# Patient Record
Sex: Female | Born: 1990 | Race: Black or African American | Hispanic: No | Marital: Single | Smoking: Never smoker
Health system: Southern US, Community
[De-identification: ages and names within clinical notes are randomized; demographics above are authoritative.]

## PROBLEM LIST (undated history)

## (undated) DIAGNOSIS — D649 Anemia, unspecified: Secondary | ICD-10-CM

## (undated) HISTORY — PX: NO PAST SURGERIES: SHX2092

---
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
## 2013-03-29 ENCOUNTER — Encounter (HOSPITAL_BASED_OUTPATIENT_CLINIC_OR_DEPARTMENT_OTHER): Payer: Self-pay | Admitting: Emergency Medicine

## 2013-03-29 ENCOUNTER — Emergency Department (HOSPITAL_BASED_OUTPATIENT_CLINIC_OR_DEPARTMENT_OTHER)
Admission: EM | Admit: 2013-03-29 | Discharge: 2013-03-29 | Disposition: A | Discharge status: Home / Self Care | Payer: Medicaid Other | Attending: Emergency Medicine | Admitting: Emergency Medicine

## 2013-03-29 DIAGNOSIS — I959 Hypotension, unspecified: Secondary | ICD-10-CM | POA: Insufficient documentation

## 2013-03-29 DIAGNOSIS — R55 Syncope and collapse: Secondary | ICD-10-CM | POA: Insufficient documentation

## 2013-03-29 DIAGNOSIS — Z3202 Encounter for pregnancy test, result negative: Secondary | ICD-10-CM | POA: Insufficient documentation

## 2013-03-29 DIAGNOSIS — Z862 Personal history of diseases of the blood and blood-forming organs and certain disorders involving the immune mechanism: Secondary | ICD-10-CM | POA: Insufficient documentation

## 2013-03-29 HISTORY — DX: Anemia, unspecified: D64.9

## 2013-03-29 LAB — URINALYSIS, ROUTINE W REFLEX MICROSCOPIC
Glucose, UA: NEGATIVE mg/dL
Hgb urine dipstick: NEGATIVE
Nitrite: NEGATIVE
Specific Gravity, Urine: 1.016 (ref 1.005–1.030)
pH: 5.5 (ref 5.0–8.0)

## 2013-03-29 LAB — CBC WITH DIFFERENTIAL/PLATELET
Basophils Relative: 0 % (ref 0–1)
Eosinophils Relative: 0 % (ref 0–5)
HCT: 34 % — ABNORMAL LOW (ref 36.0–46.0)
Hemoglobin: 11.8 g/dL — ABNORMAL LOW (ref 12.0–15.0)
MCHC: 34.7 g/dL (ref 30.0–36.0)
Neutro Abs: 3 10*3/uL (ref 1.7–7.7)
Platelets: 262 10*3/uL (ref 150–400)
RBC: 4.32 MIL/uL (ref 3.87–5.11)
WBC: 5.5 10*3/uL (ref 4.0–10.5)

## 2013-03-29 LAB — URINE MICROSCOPIC-ADD ON

## 2013-03-29 LAB — BASIC METABOLIC PANEL
Creatinine, Ser: 0.8 mg/dL (ref 0.50–1.10)
GFR calc Af Amer: >90 mL/min (ref >90)
GFR calc non Af Amer: >90 mL/min (ref >90)
Potassium: 4.2 mEq/L (ref 3.5–5.1)
Sodium: 138 mEq/L (ref 135–145)

## 2013-03-29 MED ORDER — SODIUM CHLORIDE 0.9 % IV BOLUS (SEPSIS): 1000.0000 mL | Freq: Once | INTRAVENOUS | Status: AC

## 2013-03-29 NOTE — ED Notes (Signed)
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  Caught by another employee and lowered to ground. Sitting in chair upon EMS arrival and able to answer questions but lethargic.  SBP in 60s. Up to 80 after loading on stretcher.

## 2013-03-29 NOTE — ED Provider Notes (Signed)
CSN: [id number]     Arrival date & time 03/29/13  1611 History   First MD Initiated Contact with Patient 03/29/13 1645     Chief Complaint  Patient presents with  . Loss of Consciousness  . 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   Patient is a 22 y.o. female presenting with syncope.  Loss of Consciousness Episode history:  Single Most recent episode:  Today Timing:  Constant Progression:  Resolved Chronicity:  New Context comment:  Standing at work for 3 hours Witnessed: yes   Relieved by:  Lying down Worsened by:  Nothing tried Ineffective treatments:  None tried Associated symptoms: dizziness and visual change   Associated symptoms: no anxiety, no chest pain, no confusion, no diaphoresis, no difficulty breathing, no fever, no headaches, no nausea, no palpitations, no shortness of breath, no vomiting and no weakness   Visual Change:    Location:  Both eyes   Quality: blurred vision     Onset quality:  Sudden   Progression:  Resolved   Chronicity:  New Risk factors: no congenital heart disease, no coronary artery disease, no seizures and no vascular disease     Past Medical History  Diagnosis Date  . Anemia    History reviewed. No pertinent past surgical history. No family history on file. History  Substance Use Topics  . Smoking status: Never Smoker   . Smokeless tobacco: Not on file  . Alcohol Use: No   OB History   Grav Para Term Preterm Abortions TAB SAB Ect Mult Living                 Review of Systems  Constitutional: Negative for fever, chills, diaphoresis, activity change, appetite change and fatigue.  HENT: Negative for congestion, facial swelling, rhinorrhea and sore throat.   Eyes: Negative for photophobia and discharge.  Respiratory: Negative for cough, chest tightness  and shortness of breath.   Cardiovascular: Positive for syncope. Negative for chest pain, palpitations and leg swelling.  Gastrointestinal: Negative for nausea, vomiting, abdominal pain and diarrhea.  Endocrine: Negative for polydipsia and polyuria.  Genitourinary: Negative for dysuria, frequency, difficulty urinating and pelvic pain.  Musculoskeletal: Negative for arthralgias, back pain, neck pain and neck stiffness.  Skin: Negative for color change and wound.  Allergic/Immunologic: Negative for immunocompromised state.  Neurological: Positive for dizziness. Negative for facial asymmetry, weakness, numbness and headaches.  Hematological: Does not bruise/bleed easily.  Psychiatric/Behavioral: Negative for confusion and agitation.    Allergies  Review of patient's allergies indicates no known allergies.  Home Medications  No current outpatient prescriptions on file. BP 109/70  Pulse 79  Temp(Src) 97.5 F (36.4 C) (Oral)  Resp 14  Ht 5' (1.524 m)  Wt 98 lb (44.453 kg)  BMI 19.14 kg/m2  SpO2 100%  LMP 03/21/2013 Physical Exam  Constitutional: She is oriented to person, place, and time. She appears well-developed and well-nourished. No distress.  HENT:  Head: Normocephalic and atraumatic.  Mouth/Throat: No oropharyngeal exudate.  Eyes: Pupils are equal, round, and reactive to light.  Neck: Normal range of motion. Neck supple.  Cardiovascular: Normal rate, regular rhythm and normal heart sounds.  Exam reveals no gallop and no friction rub.   No murmur heard. Pulmonary/Chest: Effort normal and breath sounds normal. No respiratory distress. She has no wheezes. She has no rales.  Abdominal: Soft. Bowel sounds are normal.  She exhibits no distension and no mass. There is no tenderness. There is no rebound and no guarding.  Musculoskeletal: Normal range of motion. She exhibits no edema and no tenderness.  Neurological: She is alert and oriented to person, place, and time. She has normal  strength. She displays no tremor. No cranial nerve deficit or sensory deficit. She exhibits normal muscle tone. She displays a negative Romberg sign. Coordination and gait normal. GCS eye subscore is 4. GCS verbal subscore is 5. GCS motor subscore is 6.  Skin: Skin is warm and dry.  Psychiatric: She has a normal mood and affect.    ED Course  Procedures (including critical care time) Labs Review Labs Reviewed  CBC WITH DIFFERENTIAL - Abnormal; Notable for the following:    Hemoglobin 11.8 (*)    HCT 34.0 (*)    All other components within normal limits  BASIC METABOLIC PANEL - Abnormal; Notable for the following:    Glucose, Bld 128 (*)    All other components within normal limits  URINALYSIS, ROUTINE W REFLEX MICROSCOPIC - Abnormal; Notable for the following:    Leukocytes, UA MODERATE (*)    All other components within normal limits  URINE MICROSCOPIC-ADD ON - Abnormal; Notable for the following:    Bacteria, UA FEW (*)    All other components within normal limits  URINE CULTURE  PREGNANCY, URINE   Imaging Review No results found.  EKG Interpretation   None      Date: 03/29/2013  Rate: 79  Rhythm: normal sinus rhythm  QRS Axis: normal  Intervals: normal  ST/T Wave abnormalities: nonspecific T wave changes  Conduction Disutrbances:none  Narrative Interpretation:   Old EKG Reviewed: unchanged       MDM   1. Syncope, vasovagal    Pt is a 22 y.o. female with Pmhx as above who presents with syncopal episode aft work after standing for 3 hours and not eating/drinking today.  Pt now asymptomatic, VSS, pt in NAD. Neuro exam unremarkable. EKG as above. Suspect vasovagal syncope.  IVF given.  Pt asked to eat meal before work, get plenty to drink during day.  Return precautions given for new or worsening symptoms including repeat episodes chest pain, trouble breathing, fever, leg swelling.          Shanna Cisco, MD 03/29/13 1734

## 2013-03-31 LAB — URINE CULTURE

## 2013-04-01 ENCOUNTER — Telehealth (HOSPITAL_COMMUNITY): Payer: Self-pay | Admitting: Emergency Medicine

## 2013-04-01 NOTE — ED Notes (Signed)
Post ED Visit - Positive Culture Follow-up  Culture report reviewed by antimicrobial stewardship pharmacist: []  Wes Dulaney, Pharm.D., BCPS [x]  Celedonio Miyamoto, Pharm.D., BCPS []  Georgina Pillion, Pharm.D., BCPS []  [place], [address].D., BCPS, AAHIVP []  Estella Husk, Pharm.D., BCPS, AAHIVP  Positive urine culture Per Sharilyn Sites PA-C, no further treatment necessary and no further patient follow-up is required at this time.  Kylie A Holland 04/01/2013, 12:01 PM

## 2013-08-21 ENCOUNTER — Encounter (HOSPITAL_BASED_OUTPATIENT_CLINIC_OR_DEPARTMENT_OTHER): Payer: Self-pay | Admitting: Emergency Medicine

## 2013-08-21 ENCOUNTER — Emergency Department (HOSPITAL_BASED_OUTPATIENT_CLINIC_OR_DEPARTMENT_OTHER)
Admission: EM | Admit: 2013-08-21 | Discharge: 2013-08-21 | Disposition: A | Discharge status: Home / Self Care | Payer: Medicaid Other | Attending: Emergency Medicine | Admitting: Emergency Medicine

## 2013-08-21 DIAGNOSIS — A499 Bacterial infection, unspecified: Secondary | ICD-10-CM | POA: Insufficient documentation

## 2013-08-21 DIAGNOSIS — Z862 Personal history of diseases of the blood and blood-forming organs and certain disorders involving the immune mechanism: Secondary | ICD-10-CM | POA: Insufficient documentation

## 2013-08-21 DIAGNOSIS — Z3202 Encounter for pregnancy test, result negative: Secondary | ICD-10-CM | POA: Insufficient documentation

## 2013-08-21 DIAGNOSIS — B9689 Other specified bacterial agents as the cause of diseases classified elsewhere: Secondary | ICD-10-CM | POA: Insufficient documentation

## 2013-08-21 DIAGNOSIS — N760 Acute vaginitis: Secondary | ICD-10-CM | POA: Insufficient documentation

## 2013-08-21 LAB — URINE MICROSCOPIC-ADD ON

## 2013-08-21 LAB — URINALYSIS, ROUTINE W REFLEX MICROSCOPIC
BILIRUBIN URINE: NEGATIVE
GLUCOSE, UA: NEGATIVE mg/dL
Ketones, ur: 15 mg/dL — AB
NITRITE: NEGATIVE
Protein, ur: NEGATIVE mg/dL
SPECIFIC GRAVITY, URINE: 1.011 (ref 1.005–1.030)
Urobilinogen, UA: 0.2 mg/dL (ref 0.0–1.0)
pH: 5.5 (ref 5.0–8.0)

## 2013-08-21 LAB — WET PREP, GENITAL
Trich, Wet Prep: NONE SEEN
YEAST WET PREP: NONE SEEN

## 2013-08-21 LAB — PREGNANCY, URINE: PREG TEST UR: NEGATIVE

## 2013-08-21 MED ORDER — METRONIDAZOLE 500 MG PO TABS: 500.0000 mg | ORAL_TABLET | Freq: Two times a day (BID) | ORAL | Status: DC

## 2013-08-21 MED ORDER — CEFTRIAXONE SODIUM 250 MG IJ SOLR
250.0000 mg | Freq: Once | INTRAMUSCULAR | Status: AC
  Administered 2013-08-21: 250 mg via INTRAMUSCULAR
  Filled 2013-08-21: qty 250

## 2013-08-21 MED ORDER — AZITHROMYCIN 250 MG PO TABS
1000.0000 mg | ORAL_TABLET | Freq: Once | ORAL | Status: AC
  Administered 2013-08-21: 1000 mg via ORAL
  Filled 2013-08-21: qty 4

## 2013-08-21 NOTE — ED Notes (Signed)
Pelvic cart to bedside 

## 2013-08-21 NOTE — ED Provider Notes (Signed)
CSN: [id number]     Arrival date & time 08/21/13  1334 History   First MD Initiated Contact with Patient 08/21/13 1348     Chief Complaint  Patient presents with  . Abdominal Pain   HPI Casey Malone is 23 y.o. female presented to the ED for abdominal pain. Sh e reports her abdominal pain started 3 days ago on her right side that was a 10/10 ache, like a bruise. She reports she had "rough unprotected  sex" the day prior to onset of pain. No vaginal bleeding. She reports the pain has been getting better today and currently is about a 2-3/10. Her LMP was last month, but she is irregular. She did have an episode of spotting 2 weeks ago, which is unusual for her. She denies heavy periods, lasts about a week. Patient denies vaginal discharge or discomfort. She reports no burning with urination, but some pain with straining in her vagina. Patient denies fever, nausea, vomit, diarrhea or constipation. She has had a STI prior (2011), she is uncertain of which. Implanon for birth control placed  2 years ago.   Past Medical History  Diagnosis Date  . Anemia    History reviewed. No pertinent past surgical history. No family history on file. History  Substance Use Topics  . Smoking status: Never Smoker   . Smokeless tobacco: Not on file  . Alcohol Use: No   OB History   Grav Para Term Preterm Abortions TAB SAB Ect Mult Living                 Review of Systems  Constitutional: Negative for fever, appetite change, fatigue and unexpected weight change.  Gastrointestinal: Positive for abdominal pain. Negative for nausea, vomiting, diarrhea, constipation, blood in stool and abdominal distention.  Genitourinary: Positive for vaginal pain and pelvic pain. Negative for dysuria, frequency, hematuria, flank pain, vaginal bleeding, vaginal discharge and genital sores.    Allergies  Review of patient's allergies indicates no known allergies.  Home Medications   Current Outpatient Rx  Name  Route  Sig   Dispense  Refill  . etonogestrel (IMPLANON) 68 MG IMPL implant   Subcutaneous   Inject 1 each into the skin once.          BP 105/65  Pulse 115  Temp(Src) 97.7 F (36.5 C)  Resp 16  Ht 5' (1.524 m)  Wt 98 lb (44.453 kg)  BMI 19.14 kg/m2  SpO2 100% Physical Exam Gen: NAD. HEENT: AT. [place]. Bilateral eyes without injections or icterus. MMM. CV: tachycardic. No murmur.  Chest: CTAB, no wheeze or crackles Abd: Soft. NTND. BS present.  Skin: No rashes, purpura or petechiae.  Neuro: Normal gait. PERLA. EOMi. Alert. Grossly intact.   GYN:  External genitalia within normal limits.  Vaginal mucosa pink, moist, normal rugae.  Nonfriable cervix without lesions, mild creamy white  Discharge, no bleeding noted on speculum exam.  Moderate odor. Bimanual exam revealed normal, nongravid uterus.  No cervical motion tenderness. No adnexal masses bilaterally.    ED Course  Procedures (including critical care time) Labs Review Labs Reviewed  URINALYSIS, ROUTINE W REFLEX MICROSCOPIC - Abnormal; Notable for the following:    APPearance CLOUDY (*)    Hgb urine dipstick TRACE (*)    Ketones, ur 15 (*)    Leukocytes, UA SMALL (*)    All other components within normal limits  URINE MICROSCOPIC-ADD ON - Abnormal; Notable for the following:    Squamous Epithelial / LPF FEW (*)  Bacteria, UA MANY (*)    Casts GRANULAR CAST (*)    All other components within normal limits  PREGNANCY, URINE   Imaging Review No results found.   EKG Interpretation None      MDM   Final diagnoses:  None    Patient will improving abdominal pain, not reproducible. Negative pregnancy test. Wet prep positive for clue cells. Gonorrhea and chlamydia cultures obtain. Prophylaxis treatment with azith and ceftriaxone today. Prescription for Flagyl for BV.     Natalia Leatherwoodenee A Keith Cancio, DO 08/22/13 [id number]

## 2013-08-21 NOTE — Discharge Instructions (Signed)
Safe Sex Safe sex is about reducing the risk of giving or getting a sexually transmitted disease (STD). STDs are spread through sexual contact involving the genitals, mouth, or rectum. Some STDS can be cured and others cannot. Safe sex can also prevent unintended pregnancies.  SAFE SEX PRACTICES  Limit your sexual activity to only one partner who is only having sex with you.  Talk to your partner about their past partners, past STDs, and drug use.  Use a condom every time you have sexual intercourse. This includes vaginal, oral, and anal sexual activity. Both females and males should wear condoms during oral sex. Only use latex or polyurethane condoms and water-based lubricants. Petroleum-based lubricants or oils used to lubricate a condom will weaken the condom and increase the chance that it will break. The condom should be in place from the beginning to the end of sexual activity. Wearing a condom reduces, but does not completely eliminate, your risk of getting or giving a STD. STDs can be spread by contact with skin of surrounding areas.  Get vaccinated for hepatitis B and HPV.  Avoid alcohol and recreational drugs which can affect your judgement. You may forget to use a condom or participate in high-risk sex.  For females, avoid douching after sexual intercourse. Douching can spread an infection farther into the reproductive tract.  Check your body for signs of sores, blisters, rashes, or unusual discharge. See your caregiver if you notice any of these signs.  Avoid sexual contact if you have symptoms of an infection or are being treated for an STD. If you or your partner has herpes, avoid sexual contact when blisters are present. Use condoms at all other times.  See your caregiver for regular screenings, examinations, and tests for STDs. Before having sex with a new partner, each of you should be screened for STDs and talk about the results with your partner. BENEFITS OF SAFE SEX   There  is less of a chance of getting or giving an STD.  You can prevent unwanted or unintended pregnancies.  By discussing safer sex concerns with your partner, you may increase feelings of intimacy, comfort, trust, and honesty between the both of you. Document Released: 07/15/2004 Document Revised: 03/01/2012 Document Reviewed: 11/29/2011 Exit[employer] Patient Information 2014 [employer].  

## 2013-08-21 NOTE — ED Notes (Signed)
Pt reports 3 days of right sided abd pain.  Denies n/v/d or dysuria.

## 2013-08-22 LAB — GC/CHLAMYDIA PROBE AMP
CT PROBE, AMP APTIMA: NEGATIVE
GC PROBE AMP APTIMA: NEGATIVE

## 2013-08-23 NOTE — ED Provider Notes (Signed)
I saw and evaluated the patient, reviewed the resident's note and I agree with the findings and plan.   EKG Interpretation None      Pt with no reproducible abd pain with mild vaginal d/c without signs of PID.  Pt treated for STD and given return precautions.  Casey SproutWhitney Niang Mitcheltree, MD 08/23/13 [phone]

## 2014-12-30 ENCOUNTER — Emergency Department (HOSPITAL_COMMUNITY)
Admission: EM | Admit: 2014-12-30 | Discharge: 2014-12-30 | Disposition: A | Discharge status: Home / Self Care | Payer: Medicaid Other | Attending: Emergency Medicine | Admitting: Emergency Medicine

## 2014-12-30 ENCOUNTER — Encounter (HOSPITAL_COMMUNITY): Payer: Self-pay | Admitting: *Deleted

## 2014-12-30 NOTE — Progress Notes (Addendum)
OB rapid response received call from [facility] after patient arrives complaining of stabbing abdominal pain. Pt has a history of "interrmittent bleeding" per report but patient states she had an ultrasound on 12/15/14 and was told everything was "normal." EDD is 04/29/15, making her gestation 22.6 weeks. FHR 140 with no UCs noted on toco. Dr Gladis RiffleAnywanu notified of patient. Due to gestational age of 22.6 weeks, order for fetal heart tones only. No need for continuous fetal heart tracing or toco monitoring. [facility] [employer] reports abdominal pain is upper and does not seem to be coming from her uterus. No vaginal bleeding or loss of fluid reported. No other orders from Dr. Gladis RiffleAnywanu.

## 2014-12-30 NOTE — ED Notes (Signed)
Toco monitor applied. OB rapid response notified.

## 2014-12-30 NOTE — ED Notes (Signed)
Incorrect patient

## 2014-12-30 NOTE — ED Notes (Signed)
Sudden onset of sharp, stabbing pain in mid abdomen beginning about 10 minutes ago while visiting patient on [address].  Patient is 5 months pregnant and has had episodic bleeding throughout pregnancy.  States placenta was appropriately placed on last US.

## 2015-04-13 ENCOUNTER — Encounter (HOSPITAL_COMMUNITY): Payer: Self-pay | Admitting: Emergency Medicine

## 2015-04-13 ENCOUNTER — Emergency Department (HOSPITAL_COMMUNITY): Payer: Medicaid Other

## 2015-04-13 ENCOUNTER — Emergency Department (HOSPITAL_COMMUNITY)
Admission: EM | Admit: 2015-04-13 | Discharge: 2015-04-13 | Disposition: A | Discharge status: Home / Self Care | Payer: Medicaid Other | Attending: Emergency Medicine | Admitting: Emergency Medicine

## 2015-04-13 DIAGNOSIS — Y92090 Kitchen in other non-institutional residence as the place of occurrence of the external cause: Secondary | ICD-10-CM | POA: Insufficient documentation

## 2015-04-13 DIAGNOSIS — R55 Syncope and collapse: Secondary | ICD-10-CM | POA: Insufficient documentation

## 2015-04-13 DIAGNOSIS — W19XXXA Unspecified fall, initial encounter: Secondary | ICD-10-CM

## 2015-04-13 DIAGNOSIS — Z3A00 Weeks of gestation of pregnancy not specified: Secondary | ICD-10-CM | POA: Insufficient documentation

## 2015-04-13 DIAGNOSIS — Y998 Other external cause status: Secondary | ICD-10-CM | POA: Insufficient documentation

## 2015-04-13 DIAGNOSIS — W1839XA Other fall on same level, initial encounter: Secondary | ICD-10-CM | POA: Insufficient documentation

## 2015-04-13 DIAGNOSIS — Y9389 Activity, other specified: Secondary | ICD-10-CM | POA: Insufficient documentation

## 2015-04-13 DIAGNOSIS — S0031XA Abrasion of nose, initial encounter: Secondary | ICD-10-CM | POA: Insufficient documentation

## 2015-04-13 DIAGNOSIS — O9A219 Injury, poisoning and certain other consequences of external causes complicating pregnancy, unspecified trimester: Secondary | ICD-10-CM | POA: Insufficient documentation

## 2015-04-13 DIAGNOSIS — Z862 Personal history of diseases of the blood and blood-forming organs and certain disorders involving the immune mechanism: Secondary | ICD-10-CM | POA: Insufficient documentation

## 2015-04-13 MED ORDER — IBUPROFEN 800 MG PO TABS: 800.0000 mg | ORAL_TABLET | Freq: Three times a day (TID) | ORAL | Status: DC | PRN

## 2015-04-13 NOTE — ED Provider Notes (Signed)
CSN: [id number]     Arrival date & time 04/13/15  1804 History   First MD Initiated Contact with Patient 04/13/15 1813     Chief Complaint  Patient presents with  . Loss of Consciousness  . Fall  . Facial Pain     (Consider location/radiation/quality/duration/timing/severity/associated sxs/prior Treatment) HPI Patient presents to the emergency department following a syncopal episode that occurred just prior to arrival.  Patient states that she had been smoking marijuana most the day and had not eaten.  She states that she was in the kitchen trying to find something to eat when she passed out.  The patient states that she has not have any chest pain, shortness of breath, nausea, vomiting, back pain, neck pain, fever, abdominal pain, shortness of breath or dizziness.  The patient states that he only area that at this time is the area just under her nose and left cheek Past Medical History  Diagnosis Date  . Anemia    History reviewed. No pertinent past surgical history. History reviewed. No pertinent family history. Social History  Substance Use Topics  . Smoking status: Never Smoker   . Smokeless tobacco: None  . Alcohol Use: No   OB History    Gravida Para Term Preterm AB TAB SAB Ectopic Multiple Living   4 1 1  1     1       Obstetric Comments   [redacted] weeks pregnant, last US 12/13/14     Review of Systems  All other systems negative except as documented in the HPI. All pertinent positives and negatives as reviewed in the HPI.  Allergies  Review of patient's allergies indicates no known allergies.  Home Medications   Prior to Admission medications   Medication Sig Start Date End Date Taking? Authorizing Provider  etonogestrel (IMPLANON) 68 MG IMPL implant Inject 1 each into the skin once.    Historical Provider, MD   Breastfeeding? Unknown Physical Exam  Constitutional: She is oriented to person, place, and time. She appears well-developed and well-nourished. No distress.   HENT:  Head: Normocephalic.  Nose:    Mouth/Throat: Oropharynx is clear and moist.  Eyes: Pupils are equal, round, and reactive to light.  Neck: Normal range of motion. Neck supple.  Cardiovascular: Normal rate, regular rhythm and normal heart sounds.  Exam reveals no gallop and no friction rub.   No murmur heard. Pulmonary/Chest: Effort normal and breath sounds normal. No respiratory distress. She has no wheezes.  Abdominal: Soft. Bowel sounds are normal. She exhibits no distension. There is no tenderness.  Neurological: She is alert and oriented to person, place, and time. She exhibits normal muscle tone. Coordination normal.  Skin: Skin is warm and dry. No rash noted. No erythema.  Psychiatric: She has a normal mood and affect. Her behavior is normal.  Nursing note and vitals reviewed.   ED Course  Procedures (including critical care time) Labs Review Labs Reviewed - No data to display  Imaging Review Ct Head Wo Contrast  04/13/2015  CLINICAL DATA:  Left facial injury after fall in kitchen. Positive loss of consciousness. EXAM: CT HEAD WITHOUT CONTRAST CT MAXILLOFACIAL WITHOUT CONTRAST CT CERVICAL SPINE WITHOUT CONTRAST TECHNIQUE: Multidetector CT imaging of the head, cervical spine, and maxillofacial structures were performed using the standard protocol without intravenous contrast. Multiplanar CT image reconstructions of the cervical spine and maxillofacial structures were also generated. COMPARISON:  None. FINDINGS: CT HEAD FINDINGS Bony calvarium appears intact. No mass effect or midline shift is noted.  Ventricular size is within normal limits. There is no evidence of mass lesion, hemorrhage or acute infarction. CT MAXILLOFACIAL FINDINGS No fracture or other bony abnormality is noted. Paranasal sinuses appear normal. Pterygoid plates appear normal. Globes and orbits appear normal. CT CERVICAL SPINE FINDINGS Reversal of normal lordosis is noted most likely positional in origin. No  fracture or spondylolisthesis is noted. Posterior facet joints appear normal. Visualized lung apices appear normal. IMPRESSION: Normal head CT. No abnormality seen in maxillofacial region. Normal cervical spine. Electronically Signed   By: Lupita Raider, M.D.   On: 04/13/2015 19:49   Ct Cervical Spine Wo Contrast  04/13/2015  CLINICAL DATA:  Left facial injury after fall in kitchen. Positive loss of consciousness. EXAM: CT HEAD WITHOUT CONTRAST CT MAXILLOFACIAL WITHOUT CONTRAST CT CERVICAL SPINE WITHOUT CONTRAST TECHNIQUE: Multidetector CT imaging of the head, cervical spine, and maxillofacial structures were performed using the standard protocol without intravenous contrast. Multiplanar CT image reconstructions of the cervical spine and maxillofacial structures were also generated. COMPARISON:  None. FINDINGS: CT HEAD FINDINGS Bony calvarium appears intact. No mass effect or midline shift is noted. Ventricular size is within normal limits. There is no evidence of mass lesion, hemorrhage or acute infarction. CT MAXILLOFACIAL FINDINGS No fracture or other bony abnormality is noted. Paranasal sinuses appear normal. Pterygoid plates appear normal. Globes and orbits appear normal. CT CERVICAL SPINE FINDINGS Reversal of normal lordosis is noted most likely positional in origin. No fracture or spondylolisthesis is noted. Posterior facet joints appear normal. Visualized lung apices appear normal. IMPRESSION: Normal head CT. No abnormality seen in maxillofacial region. Normal cervical spine. Electronically Signed   By: Lupita Raider, M.D.   On: 04/13/2015 19:49   Ct Maxillofacial Wo Cm  04/13/2015  CLINICAL DATA:  Left facial injury after fall in kitchen. Positive loss of consciousness. EXAM: CT HEAD WITHOUT CONTRAST CT MAXILLOFACIAL WITHOUT CONTRAST CT CERVICAL SPINE WITHOUT CONTRAST TECHNIQUE: Multidetector CT imaging of the head, cervical spine, and maxillofacial structures were performed using the standard  protocol without intravenous contrast. Multiplanar CT image reconstructions of the cervical spine and maxillofacial structures were also generated. COMPARISON:  None. FINDINGS: CT HEAD FINDINGS Bony calvarium appears intact. No mass effect or midline shift is noted. Ventricular size is within normal limits. There is no evidence of mass lesion, hemorrhage or acute infarction. CT MAXILLOFACIAL FINDINGS No fracture or other bony abnormality is noted. Paranasal sinuses appear normal. Pterygoid plates appear normal. Globes and orbits appear normal. CT CERVICAL SPINE FINDINGS Reversal of normal lordosis is noted most likely positional in origin. No fracture or spondylolisthesis is noted. Posterior facet joints appear normal. Visualized lung apices appear normal. IMPRESSION: Normal head CT. No abnormality seen in maxillofacial region. Normal cervical spine. Electronically Signed   By: Lupita Raider, M.D.   On: 04/13/2015 19:49   I have personally reviewed and evaluated these images and lab results as part of my medical decision-making.   Return here as needed.  The patient most likely passed out due to the fact she had not eaten all day and then she has been smoking a lot of marijuana.   Charlestine Night, PA-C 04/13/15 1959  Linwood Dibbles, MD 04/13/15 2021

## 2015-04-13 NOTE — ED Notes (Signed)
Pt here via EMS for eval of pain to left maxilla. Pt was in kitchen passed out, fell face forward into floor. Pt denies neck pain, back pain. No significant medical hx. 6/10 facial pain. EMS reports THC on board. Pt was iinitially hypotensive, but that has since resolved.

## 2015-04-13 NOTE — Discharge Instructions (Signed)
Near CT scan is were normal.  Return here as needed.  Follow-up with a primary care doctor, urgent care

## 2015-08-01 ENCOUNTER — Encounter (HOSPITAL_BASED_OUTPATIENT_CLINIC_OR_DEPARTMENT_OTHER): Payer: Self-pay | Admitting: *Deleted

## 2015-08-01 ENCOUNTER — Emergency Department (HOSPITAL_BASED_OUTPATIENT_CLINIC_OR_DEPARTMENT_OTHER)
Admission: EM | Admit: 2015-08-01 | Discharge: 2015-08-01 | Disposition: A | Discharge status: Home / Self Care | Payer: No Typology Code available for payment source | Attending: Emergency Medicine | Admitting: Emergency Medicine

## 2015-08-01 DIAGNOSIS — Y998 Other external cause status: Secondary | ICD-10-CM | POA: Insufficient documentation

## 2015-08-01 DIAGNOSIS — Y9389 Activity, other specified: Secondary | ICD-10-CM | POA: Diagnosis not present

## 2015-08-01 DIAGNOSIS — Y92410 Unspecified street and highway as the place of occurrence of the external cause: Secondary | ICD-10-CM | POA: Diagnosis not present

## 2015-08-01 DIAGNOSIS — S3992XA Unspecified injury of lower back, initial encounter: Secondary | ICD-10-CM | POA: Diagnosis not present
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
## 2017-07-15 ENCOUNTER — Other Ambulatory Visit: Payer: Self-pay

## 2017-07-15 ENCOUNTER — Encounter (HOSPITAL_BASED_OUTPATIENT_CLINIC_OR_DEPARTMENT_OTHER): Payer: Self-pay | Admitting: Emergency Medicine

## 2017-07-15 ENCOUNTER — Emergency Department (HOSPITAL_BASED_OUTPATIENT_CLINIC_OR_DEPARTMENT_OTHER)
Admission: EM | Admit: 2017-07-15 | Discharge: 2017-07-15 | Disposition: A | Discharge status: Home / Self Care | Payer: Self-pay | Attending: Emergency Medicine | Admitting: Emergency Medicine

## 2017-07-15 DIAGNOSIS — Z202 Contact with and (suspected) exposure to infections with a predominantly sexual mode of transmission: Secondary | ICD-10-CM | POA: Insufficient documentation

## 2017-07-15 DIAGNOSIS — Z5321 Procedure and treatment not carried out due to patient leaving prior to being seen by health care provider: Secondary | ICD-10-CM | POA: Insufficient documentation

## 2017-07-15 LAB — URINALYSIS, ROUTINE W REFLEX MICROSCOPIC
Bilirubin Urine: NEGATIVE
Glucose, UA: NEGATIVE mg/dL
Hgb urine dipstick: NEGATIVE
Ketones, ur: NEGATIVE mg/dL
Leukocytes, UA: NEGATIVE
Nitrite: NEGATIVE
PH: 5.5 (ref 5.0–8.0)
Protein, ur: NEGATIVE mg/dL
SPECIFIC GRAVITY, URINE: 1.02 (ref 1.005–1.030)

## 2017-07-15 LAB — PREGNANCY, URINE: Preg Test, Ur: NEGATIVE

## 2017-07-15 NOTE — ED Notes (Signed)
No answer in lobby.

## 2017-07-15 NOTE — ED Notes (Signed)
Pt called X2 to be roomed. No answer from lobby. EMT even walked down to other waiting area and no answer.

## 2017-07-15 NOTE — ED Triage Notes (Signed)
Pt states she has been exposed to a std starts with  "C".  Pt is having burning, itching and frequency over one month.

## 2017-11-06 ENCOUNTER — Emergency Department (HOSPITAL_COMMUNITY)
Admission: EM | Admit: 2017-11-06 | Discharge: 2017-11-06 | Discharge status: Against Medical Advice | Payer: Self-pay | Attending: Emergency Medicine | Admitting: Emergency Medicine

## 2017-11-06 ENCOUNTER — Encounter (HOSPITAL_COMMUNITY): Payer: Self-pay | Admitting: Emergency Medicine

## 2017-11-06 DIAGNOSIS — Y939 Activity, unspecified: Secondary | ICD-10-CM | POA: Insufficient documentation

## 2017-11-06 DIAGNOSIS — Z79899 Other long term (current) drug therapy: Secondary | ICD-10-CM | POA: Insufficient documentation

## 2017-11-06 DIAGNOSIS — S60442A External constriction of right middle finger, initial encounter: Secondary | ICD-10-CM | POA: Insufficient documentation

## 2017-11-06 DIAGNOSIS — Y998 Other external cause status: Secondary | ICD-10-CM | POA: Insufficient documentation

## 2017-11-06 DIAGNOSIS — W4904XA Ring or other jewelry causing external constriction, initial encounter: Secondary | ICD-10-CM | POA: Insufficient documentation

## 2017-11-06 DIAGNOSIS — Y929 Unspecified place or not applicable: Secondary | ICD-10-CM | POA: Insufficient documentation

## 2017-11-06 DIAGNOSIS — S60449A External constriction of unspecified finger, initial encounter: Secondary | ICD-10-CM

## 2017-11-06 NOTE — ED Notes (Addendum)
Pt eloped; ambulatory and steady gait.

## 2017-11-06 NOTE — ED Notes (Signed)
Pt gave consent to have RN cut her ring off.

## 2017-11-06 NOTE — ED Provider Notes (Signed)
[facility] EMERGENCY DEPARTMENT Provider Note   CSN: [id number] Arrival date & time: 11/06/17  1654     History   Chief Complaint Chief Complaint  Patient presents with  . Ring stuck on finger    HPI Casey Malone is a 27 y.o. female.  The history is provided by the patient and medical records. No language interpreter was used.   Casey Malone is a 27 y.o. female  who presents to the Emergency Department requesting ring be removed from right middle finger. Ring has been getting to tight. She tried to remove it on her own, but it bent, causing her more discomfort. No numbness or tingling. No difficulty moving the finger.   Past Medical History:  Diagnosis Date  . Anemia     There are no active problems to display for this patient.   History reviewed. No pertinent surgical history.   OB History    Gravida  4   Para  1   Term  1   Preterm      AB  1   Living  1     SAB      TAB      Ectopic      Multiple      Live Births           Obstetric Comments  [redacted] weeks pregnant, last [place] 12/13/14         Home Medications    Prior to Admission medications   Medication Sig Start Date End Date Taking? Authorizing Provider  etonogestrel (IMPLANON) 68 MG IMPL implant Inject 1 each into the skin once.    [provider]  ibuprofen (ADVIL,MOTRIN) 800 MG tablet Take 1 tablet (800 mg total) by mouth every 8 (eight) hours as needed. 04/13/15   Charlestine Night, PA-C    Family History No family history on file.  Social History Social History   Tobacco Use  . Smoking status: Never Smoker  . Smokeless tobacco: Never Used  Substance Use Topics  . Alcohol use: No  . Drug use: Yes    Types: Marijuana     Allergies   Patient has no known allergies.   Review of Systems Review of Systems  Musculoskeletal: Positive for myalgias (Finger). Negative for joint swelling.     Physical Exam Updated Vital Signs BP 110/80  (BP Location: Right Arm)   Pulse (!) 108   Temp 98.9 F (37.2 C) (Oral)   Resp 16   SpO2 100%   Physical Exam  Constitutional: She appears well-developed and well-nourished. No distress.  HENT:  Head: Normocephalic and atraumatic.  Neck: Neck supple.  Cardiovascular: Regular rhythm and normal heart sounds.  No murmur heard. Pulmonary/Chest: Effort normal and breath sounds normal. No respiratory distress.  Musculoskeletal:  Full ROM of all digits on right hand. No tenderness. Good cap refill. Sensation intact.  Neurological: She is alert.  Skin: Skin is warm and dry.  Nursing note and vitals reviewed.    ED Treatments / Results  Labs (all labs ordered are listed, but only abnormal results are displayed) Labs Reviewed - No data to display  EKG None  Radiology No results found.  Procedures Procedures (including critical care time)  Medications Ordered in ED Medications - No data to display   Initial Impression / Assessment and Plan / ED Course  I have reviewed the triage vital signs and the nursing notes.  Pertinent labs & imaging results that were available during my care  of the patient were reviewed by me and considered in my medical decision making (see chart for details).     Casey Malone is a 27 y.o. female who presents to ED for removal of ring. Ring removed by nursing staff. NVI. No indication for imaging. Patient did leave prior to being discharged.    Final Clinical Impressions(s) / ED Diagnoses   Final diagnoses:  Constrictive jewelry of finger, initial encounter    ED Discharge Orders    None       Ward, Chase Picket, PA-C 11/06/17 1837    Terrilee Files, MD 11/07/17 [phone]

## 2017-11-06 NOTE — ED Triage Notes (Signed)
Pt states she would like to have ring cut off R hand middle finger, ring noted to be bent on finger causing pain

## 2020-06-21 NOTE — L&D Delivery Note (Addendum)
LABOR COURSE Pt arrived at 0645 for scheduled IOL for IUGR. Cytotec was given for cervical ripening, followed by foley bulb and pitocin. Epidural was started for pain control. She progressed to complete with urge to push without complication  Delivery Note Called to room and patient was complete and pushing. Head delivered LOA without delivery. No nuchal cord present. Shoulder and body delivered with ease. At  2103 a healthy female was delivered via Vaginal, Spontaneous (Presentation:LOA ).  Infant with spontaneous cry, placed on mother's abdomen, dried and stimulated. Cord clamped x 2 after 1-minute delay, and cut by father of baby. Cord blood drawn. Placenta delivered spontaneously with gentle cord traction. Appears intact. Fundus firm with massage and Pitocin. Labia, perineum, vagina, and cervix inspected with 2nd degree perineal laceration.    APGAR: 9, 9; weight pending.   Cord: 3VC with the following complications:none.   Cord pH: pending  Anesthesia: epidural Episiotomy: None Lacerations: 2nd degree perineal Suture Repair: 3.0 vicryl Est. Blood Loss (mL): 50  Mom to postpartum.  Baby to Couplet care / Skin to Skin.  Reuel Boom, PGY-1, Faculty Service 03/27/21 10:28 PM     I attest that I was present and gowned for the delivery and repair and I agree with the above documentation and findings.   Luna Kitchens

## 2020-08-05 ENCOUNTER — Other Ambulatory Visit: Payer: Self-pay

## 2020-08-05 ENCOUNTER — Emergency Department (HOSPITAL_BASED_OUTPATIENT_CLINIC_OR_DEPARTMENT_OTHER)
Admission: EM | Admit: 2020-08-05 | Discharge: 2020-08-06 | Disposition: A | Discharge status: Home / Self Care | Payer: Medicaid Other | Attending: Emergency Medicine | Admitting: Emergency Medicine

## 2020-08-05 ENCOUNTER — Emergency Department (HOSPITAL_BASED_OUTPATIENT_CLINIC_OR_DEPARTMENT_OTHER): Admission: EM | Admit: 2020-08-05 | Discharge: 2020-08-05 | Discharge status: Absent without leave | Payer: Self-pay

## 2020-08-05 ENCOUNTER — Encounter (HOSPITAL_BASED_OUTPATIENT_CLINIC_OR_DEPARTMENT_OTHER): Payer: Self-pay | Admitting: *Deleted

## 2020-08-05 DIAGNOSIS — R110 Nausea: Secondary | ICD-10-CM | POA: Insufficient documentation

## 2020-08-05 DIAGNOSIS — Z5321 Procedure and treatment not carried out due to patient leaving prior to being seen by health care provider: Secondary | ICD-10-CM | POA: Insufficient documentation

## 2020-08-05 DIAGNOSIS — Z3201 Encounter for pregnancy test, result positive: Secondary | ICD-10-CM | POA: Diagnosis not present

## 2020-08-05 NOTE — ED Triage Notes (Signed)
C/o nausea x 1 day , + preg test at home

## 2020-08-06 NOTE — ED Notes (Signed)
during  triage pt decided to leave

## 2020-09-10 ENCOUNTER — Other Ambulatory Visit: Payer: Self-pay

## 2020-09-10 ENCOUNTER — Ambulatory Visit (INDEPENDENT_AMBULATORY_CARE_PROVIDER_SITE_OTHER): Payer: Medicaid Other

## 2020-09-10 VITALS — BP 107/62 | HR 104 | Ht 60.0 in | Wt 97.1 lb

## 2020-09-10 DIAGNOSIS — Z3491 Encounter for supervision of normal pregnancy, unspecified, first trimester: Secondary | ICD-10-CM | POA: Insufficient documentation

## 2020-09-10 DIAGNOSIS — O219 Vomiting of pregnancy, unspecified: Secondary | ICD-10-CM

## 2020-09-10 DIAGNOSIS — Z3481 Encounter for supervision of other normal pregnancy, first trimester: Secondary | ICD-10-CM

## 2020-09-10 DIAGNOSIS — Z3483 Encounter for supervision of other normal pregnancy, third trimester: Secondary | ICD-10-CM | POA: Insufficient documentation

## 2020-09-10 MED ORDER — PROMETHAZINE HCL 25 MG PO TABS: 25.0000 mg | ORAL_TABLET | Freq: Four times a day (QID) | ORAL | 2 refills | Status: DC | PRN

## 2020-09-10 NOTE — Progress Notes (Signed)
Patient was assessed and managed by nursing staff during this encounter. I have reviewed the chart and agree with the documentation and plan. I have also made any necessary editorial changes.  Warden Fillers, MD 09/10/2020 1:10 PM

## 2020-09-10 NOTE — Progress Notes (Signed)
PRENATAL INTAKE SUMMARY  Casey Malone presents today New OB Nurse Interview.  OB History    Gravida  2   Para  1   Term  1   Preterm      AB  0   Living  1     SAB      IAB      Ectopic      Multiple      Live Births  1          I have reviewed the patient's medical, obstetrical, social, and family histories, medications, and available lab results.  SUBJECTIVE She has no unusual complaints  OBJECTIVE Initial Physical Exam (New OB)  GENERAL APPEARANCE: alert, well appearing   ASSESSMENT Normal pregnancy  PLAN Prenatal care to be completed at [facility] All New OB labs to be completed at [facility] provider visit Baby Scripts Ordered Patient has BP cuff at home U/S already performed PHQ 2 score: 1 GAD 7 score: 1

## 2020-09-11 ENCOUNTER — Ambulatory Visit (INDEPENDENT_AMBULATORY_CARE_PROVIDER_SITE_OTHER): Payer: Medicaid Other | Admitting: Obstetrics and Gynecology

## 2020-09-11 DIAGNOSIS — Z3046 Encounter for surveillance of implantable subdermal contraceptive: Secondary | ICD-10-CM | POA: Diagnosis not present

## 2020-09-11 NOTE — Progress Notes (Signed)
Pt is currently pregnant and in the office for nexplanon removal. Nexplanon was originally inserted in 2012

## 2020-09-11 NOTE — Progress Notes (Signed)
° ° ° °  GYNECOLOGY OFFICE PROCEDURE NOTE  Casey Malone is a 30 y.o. G2P1001 here for Nexplanon removal.   No other gynecologic concerns.   Nexplanon Removal Patient identified, informed consent performed, consent signed.   Appropriate time out taken. Nexplanon site identified.  Area prepped in usual sterile fashon. One ml of 1% lidocaine was used to anesthetize the area at the distal end of the implant. A small stab incision was made right beside the implant on the distal portion.  The Nexplanon rod was grasped using hemostats and removed without difficulty.  There was minimal blood loss. There were no complications.  3 ml of 1% lidocaine was injected around the incision for post-procedure analgesia.  Steri-strips were applied over the small incision and a single sticch of 3-0 vicryl was placed for cosmesis.  A pressure bandage was applied to reduce any bruising.  The patient tolerated the procedure well and was given post procedure instructions.  Patient is currently pregnant. She will return in two weeks for suture removal   Mariel Aloe, MD, FACOG Obstetrician & Gynecologist, [facility] for [employer], [facility]

## 2020-09-17 ENCOUNTER — Ambulatory Visit (INDEPENDENT_AMBULATORY_CARE_PROVIDER_SITE_OTHER): Payer: Medicaid Other | Admitting: Women's Health

## 2020-09-17 ENCOUNTER — Encounter: Payer: Self-pay | Admitting: Women's Health

## 2020-09-17 ENCOUNTER — Other Ambulatory Visit (HOSPITAL_COMMUNITY)
Admission: RE | Admit: 2020-09-17 | Discharge: 2020-09-17 | Disposition: A | Discharge status: Home / Self Care | Payer: Medicaid Other | Source: Ambulatory Visit | Attending: Women's Health | Admitting: Women's Health

## 2020-09-17 ENCOUNTER — Other Ambulatory Visit: Payer: Self-pay

## 2020-09-17 DIAGNOSIS — Z3481 Encounter for supervision of other normal pregnancy, first trimester: Secondary | ICD-10-CM

## 2020-09-17 DIAGNOSIS — Z283 Underimmunization status: Secondary | ICD-10-CM

## 2020-09-17 DIAGNOSIS — A599 Trichomoniasis, unspecified: Secondary | ICD-10-CM

## 2020-09-17 DIAGNOSIS — O09899 Supervision of other high risk pregnancies, unspecified trimester: Secondary | ICD-10-CM

## 2020-09-17 DIAGNOSIS — Z2839 Other underimmunization status: Secondary | ICD-10-CM

## 2020-09-17 DIAGNOSIS — O99891 Other specified diseases and conditions complicating pregnancy: Secondary | ICD-10-CM

## 2020-09-17 NOTE — Progress Notes (Signed)
Patient presents for NOB appt today. NOB Intake completed: 09/10/20.  Genetic Screening: Desires patient wants to know gender.  Last pap: N/A   CC: None

## 2020-09-17 NOTE — Patient Instructions (Signed)
Maternity Assessment Unit (MAU)  The Maternity Assessment Unit (MAU) is located at the [facility] at [facility]. The address is: [address] [place], [place], [place] [address]. Please see map below for additional directions.    The Maternity Assessment Unit is designed to help you during your pregnancy, and for up to 6 weeks after delivery, with any pregnancy- or postpartum-related emergencies, if you think you are in labor, or if your water has broken. For example, if you experience nausea and vomiting, vaginal bleeding, severe abdominal or pelvic pain, elevated blood pressure or other problems related to your pregnancy or postpartum time, please come to the Maternity Assessment Unit for assistance.        Obstetrics: Normal and Problem Pregnancies (7th ed., pp. 102-121). [place], [place]: Elsevier."> Textbook of Family Medicine (9th ed., pp. [phone]). [place], [place]: Elsevier [employer].">  First Trimester of Pregnancy  The first trimester of pregnancy starts on the first day of your last menstrual period until the end of week 12. This is months 1 through 3 of pregnancy. A week after a sperm fertilizes an egg, the egg will implant into the wall of the uterus and begin to develop into a baby. By the end of 12 weeks, all the baby's organs will be formed and the baby will be 2-3 inches in size. Body changes during your first trimester Your body goes through many changes during pregnancy. The changes vary and generally return to normal after your baby is born. Physical changes  You may gain or lose weight.  Your breasts may begin to grow larger and become tender. The tissue that surrounds your nipples (areola) may become darker.  Dark spots or blotches (chloasma or mask of pregnancy) may develop on your face.  You may have changes in your hair. These can include thickening or thinning of your hair or changes in texture. Health changes  You may feel  nauseous, and you may vomit.  You may have heartburn.  You may develop headaches.  You may develop constipation.  Your gums may bleed and may be sensitive to brushing and flossing. Other changes  You may tire easily.  You may urinate more often.  Your menstrual periods will stop.  You may have a loss of appetite.  You may develop cravings for certain kinds of food.  You may have changes in your emotions from day to day.  You may have more vivid and strange dreams. Follow these instructions at home: Medicines  Follow your health care provider's instructions regarding medicine use. Specific medicines may be either safe or unsafe to take during pregnancy. Do not take any medicines unless told to by your health care provider.  Take a prenatal vitamin that contains at least 600 micrograms (mcg) of folic acid. Eating and drinking  Eat a healthy diet that includes fresh fruits and vegetables, whole grains, good sources of protein such as meat, eggs, or tofu, and low-fat dairy products.  Avoid raw meat and unpasteurized juice, milk, and cheese. These carry germs that can harm you and your baby.  If you feel nauseous or you vomit: ? Eat 4 or 5 small meals a day instead of 3 large meals. ? Try eating a few soda crackers. ? Drink liquids between meals instead of during meals.  You may need to take these actions to prevent or treat constipation: ? Drink enough fluid to keep your urine pale yellow. ? Eat foods that are high in fiber, such  as beans, whole grains, and fresh fruits and vegetables. ? Limit foods that are high in fat and processed sugars, such as fried or sweet foods. Activity  Exercise only as directed by your health care provider. Most people can continue their usual exercise routine during pregnancy. Try to exercise for 30 minutes at least 5 days a week.  Stop exercising if you develop pain or cramping in the lower abdomen or lower back.  Avoid exercising if it is  very hot or humid or if you are at high altitude.  Avoid heavy lifting.  If you choose to, you may have sex unless your health care provider tells you not to. Relieving pain and discomfort  Wear a good support bra to relieve breast tenderness.  Rest with your legs elevated if you have leg cramps or low back pain.  If you develop bulging veins (varicose veins) in your legs: ? Wear support hose as told by your health care provider. ? Elevate your feet for 15 minutes, 3-4 times a day. ? Limit salt in your diet. Safety  Wear your seat belt at all times when driving or riding in a car.  Talk with your health care provider if someone is verbally or physically abusive to you.  Talk with your health care provider if you are feeling sad or have thoughts of hurting yourself. Lifestyle  Do not use hot tubs, steam rooms, or saunas.  Do not douche. Do not use tampons or scented sanitary pads.  Do not use herbal remedies, alcohol, illegal drugs, or medicines that are not approved by your health care provider. Chemicals in these products can harm your baby.  Do not use any products that contain nicotine or tobacco, such as cigarettes, e-cigarettes, and chewing tobacco. If you need help quitting, ask your health care provider.  Avoid cat litter boxes and soil used by cats. These carry germs that can cause birth defects in the baby and possibly loss of the unborn baby (fetus) by miscarriage or stillbirth. General instructions  During routine prenatal visits in the first trimester, your health care provider will do a physical exam, perform necessary tests, and ask you how things are going. Keep all follow-up visits. This is important.  Ask for help if you have counseling or nutritional needs during pregnancy. Your health care provider can offer advice or refer you to specialists for help with various needs.  Schedule a dentist appointment. At home, brush your teeth with a soft toothbrush. Floss  gently.  Write down your questions. Take them to your prenatal visits. Where to find more information  American Pregnancy Association: americanpregnancy.org  [employer]: https://www.todd-brady.net/  Office on [employer] Health: MightyRew[employer]co.nz Contact a health care provider if you have:  Dizziness.  A fever.  Mild pelvic cramps, pelvic pressure, or nagging pain in the abdominal area.  Nausea, vomiting, or diarrhea that lasts for 24 hours or longer.  A bad-smelling vaginal discharge.  Pain when you urinate.  Known exposure to a contagious illness, such as chickenpox, measles, Zika virus, HIV, or hepatitis. Get help right away if you have:  Spotting or bleeding from your vagina.  Severe abdominal cramping or pain.  Shortness of breath or chest pain.  Any kind of trauma, such as from a fall or a car crash.  New or increased pain, swelling, or redness in an arm or leg. Summary  The first trimester of pregnancy starts on the first day of your last menstrual period until  the end of week 12 (months 1 through 3).  Eating 4 or 5 small meals a day rather than 3 large meals may help to relieve nausea and vomiting.  Do not use any products that contain nicotine or tobacco, such as cigarettes, e-cigarettes, and chewing tobacco. If you need help quitting, ask your health care provider.  Keep all follow-up visits. This is important. This information is not intended to replace advice given to you by your health care provider. Make sure you discuss any questions you have with your health care provider. Document Revised: 11/14/2019 Document Reviewed: 09/20/2019 Elsevier Patient Education  2021 Elsevier Inc.        Second Trimester of Pregnancy  The second trimester of pregnancy is from week 13 through week 27. This is months 4 through 6 of pregnancy. The second trimester is often a time when you feel your best. Your  body has adjusted to being pregnant, and you begin to feel better physically. During the second trimester:  Morning sickness has lessened or stopped completely.  You may have more energy.  You may have an increase in appetite. The second trimester is also a time when the unborn baby (fetus) is growing rapidly. At the end of the sixth month, the fetus may be up to 12 inches long and weigh about 1 pounds. You will likely begin to feel the baby move (quickening) between 16 and 20 weeks of pregnancy. Body changes during your second trimester Your body continues to go through many changes during your second trimester. The changes vary and generally return to normal after the baby is born. Physical changes  Your weight will continue to increase. You will notice your lower abdomen bulging out.  You may begin to get stretch marks on your hips, abdomen, and breasts.  Your breasts will continue to grow and to become tender.  Dark spots or blotches (chloasma or mask of pregnancy) may develop on your face.  A dark line from your belly button to the pubic area (linea nigra) may appear.  You may have changes in your hair. These can include thickening of your hair, rapid growth, and changes in texture. Some people also have hair loss during or after pregnancy, or hair that feels dry or thin. Health changes  You may develop headaches.  You may have heartburn.  You may develop constipation.  You may develop hemorrhoids or swollen, bulging veins (varicose veins).  Your gums may bleed and may be sensitive to brushing and flossing.  You may urinate more often because the fetus is pressing on your bladder.  You may have back pain. This is caused by: ? Weight gain. ? Pregnancy hormones that are relaxing the joints in your pelvis. ? A shift in weight and the muscles that support your balance. Follow these instructions at home: Medicines  Follow your health care provider's instructions  regarding medicine use. Specific medicines may be either safe or unsafe to take during pregnancy. Do not take any medicines unless approved by your health care provider.  Take a prenatal vitamin that contains at least 600 micrograms (mcg) of folic acid. Eating and drinking  Eat a healthy diet that includes fresh fruits and vegetables, whole grains, good sources of protein such as meat, eggs, or tofu, and low-fat dairy products.  Avoid raw meat and unpasteurized juice, milk, and cheese. These carry germs that can harm you and your baby.  You may need to take these actions to prevent or treat constipation: ? Drink  enough fluid to keep your urine pale yellow. ? Eat foods that are high in fiber, such as beans, whole grains, and fresh fruits and vegetables. ? Limit foods that are high in fat and processed sugars, such as fried or sweet foods. Activity  Exercise only as directed by your health care provider. Most people can continue their usual exercise routine during pregnancy. Try to exercise for 30 minutes at least 5 days a week. Stop exercising if you develop contractions in your uterus.  Stop exercising if you develop pain or cramping in the lower abdomen or lower back.  Avoid exercising if it is very hot or humid or if you are at a high altitude.  Avoid heavy lifting.  If you choose to, you may have sex unless your health care provider tells you not to. Relieving pain and discomfort  Wear a supportive bra to prevent discomfort from breast tenderness.  Take warm sitz baths to soothe any pain or discomfort caused by hemorrhoids. Use hemorrhoid cream if your health care provider approves.  Rest with your legs raised (elevated) if you have leg cramps or low back pain.  If you develop varicose veins: ? Wear support hose as told by your health care provider. ? Elevate your feet for 15 minutes, 3-4 times a day. ? Limit salt in your diet. Safety  Wear your seat belt at all times when  driving or riding in a car.  Talk with your health care provider if someone is verbally or physically abusive to you. Lifestyle  Do not use hot tubs, steam rooms, or saunas.  Do not douche. Do not use tampons or scented sanitary pads.  Avoid cat litter boxes and soil used by cats. These carry germs that can cause birth defects in the baby and possibly loss of the fetus by miscarriage or stillbirth.  Do not use herbal remedies, alcohol, illegal drugs, or medicines that are not approved by your health care provider. Chemicals in these products can harm your baby.  Do not use any products that contain nicotine or tobacco, such as cigarettes, e-cigarettes, and chewing tobacco. If you need help quitting, ask your health care provider. General instructions  During a routine prenatal visit, your health care provider will do a physical exam and other tests. He or she will also discuss your overall health. Keep all follow-up visits. This is important.  Ask your health care provider for a referral to a local prenatal education class.  Ask for help if you have counseling or nutritional needs during pregnancy. Your health care provider can offer advice or refer you to specialists for help with various needs. Where to find more information  American Pregnancy Association: americanpregnancy.org  [employer] Gynecologists: https://www.todd-brady.net/  Office on [employer] Health: [employer].nz Contact a health care provider if you have:  A headache that does not go away when you take medicine.  Vision changes or you see spots in front of your eyes.  Mild pelvic cramps, pelvic pressure, or nagging pain in the abdominal area.  Persistent nausea, vomiting, or diarrhea.  A bad-smelling vaginal discharge or foul-smelling urine.  Pain when you urinate.  Sudden or extreme swelling of your face, hands, ankles, feet, or legs.  A fever. Get help right  away if you:  Have fluid leaking from your vagina.  Have spotting or bleeding from your vagina.  Have severe abdominal cramping or pain.  Have difficulty breathing.  Have chest pain.  Have fainting spells.  Have  not felt your baby move for the time period told by your health care provider.  Have new or increased pain, swelling, or redness in an arm or leg. Summary  The second trimester of pregnancy is from week 13 through week 27 (months 4 through 6).  Do not use herbal remedies, alcohol, illegal drugs, or medicines that are not approved by your health care provider. Chemicals in these products can harm your baby.  Exercise only as directed by your health care provider. Most people can continue their usual exercise routine during pregnancy.  Keep all follow-up visits. This is important. This information is not intended to replace advice given to you by your health care provider. Make sure you discuss any questions you have with your health care provider. Document Revised: 11/14/2019 Document Reviewed: 09/20/2019 Elsevier Patient Education  2021 Elsevier Inc.        Round Ligament Pain  The round ligament is a cord of muscle and tissue that helps support the uterus. It can become a source of pain during pregnancy if it becomes stretched or twisted as the baby grows. The pain usually begins in the second trimester (13-28 weeks) of pregnancy, and it can come and go until the baby is delivered. It is not a serious problem, and it does not cause harm to the baby. Round ligament pain is usually a short, sharp, and pinching pain, but it can also be a dull, lingering, and aching pain. The pain is felt in the lower side of the abdomen or in the groin. It usually starts deep in the groin and moves up to the outside of the hip area. The pain may occur when you:  Suddenly change position, such as quickly going from a sitting to standing position.  Roll over in bed.  Cough or  sneeze.  Do physical activity. Follow these instructions at home:  Watch your condition for any changes.  When the pain starts, relax. Then try any of these methods to help with the pain: ? Sitting down. ? Flexing your knees up to your abdomen. ? Lying on your side with one pillow under your abdomen and another pillow between your legs. ? Sitting in a warm bath for 15-20 minutes or until the pain goes away.  Take over-the-counter and prescription medicines only as told by your health care provider.  Move slowly when you sit down or stand up.  Avoid long walks if they cause pain.  Stop or reduce your physical activities if they cause pain.  Keep all follow-up visits as told by your health care provider. This is important.   Contact a health care provider if:  Your pain does not go away with treatment.  You feel pain in your back that you did not have before.  Your medicine is not helping. Get help right away if:  You have a fever or chills.  You develop uterine contractions.  You have vaginal bleeding.  You have nausea or vomiting.  You have diarrhea.  You have pain when you urinate. Summary  Round ligament pain is felt in the lower abdomen or groin. It is usually a short, sharp, and pinching pain. It can also be a dull, lingering, and aching pain.  This pain usually begins in the second trimester (13-28 weeks). It occurs because the uterus is stretching with the growing baby, and it is not harmful to the baby.  You may notice the pain when you suddenly change position, when you cough or sneeze, or  during physical activity.  Relaxing, flexing your knees to your abdomen, lying on one side, or taking a warm bath may help to get rid of the pain.  Get help from your health care provider if the pain does not go away or if you have vaginal bleeding, nausea, vomiting, diarrhea, or painful urination. This information is not intended to replace advice given to you by your  health care provider. Make sure you discuss any questions you have with your health care provider. Document Revised: 11/23/2017 Document Reviewed: 11/23/2017 Elsevier Patient Education  2021 Elsevier Inc.                        Safe Medications in Pregnancy    Acne: Benzoyl Peroxide Salicylic Acid  Backache/Headache: Tylenol: 2 regular strength every 4 hours OR              2 Extra strength every 6 hours  Colds/Coughs/Allergies: Benadryl (alcohol free) 25 mg every 6 hours as needed Breath right strips Claritin Cepacol throat lozenges Chloraseptic throat spray Cold-Eeze- up to three times per day Cough drops, alcohol free Flonase (by prescription only) Guaifenesin Mucinex Robitussin DM (plain only, alcohol free) Saline nasal spray/drops Sudafed (pseudoephedrine) & Actifed ** use only after [redacted] weeks gestation and if you do not have high blood pressure Tylenol Vicks Vaporub Zinc lozenges Zyrtec   Constipation: Colace Ducolax suppositories Fleet enema Glycerin suppositories Metamucil Milk of magnesia Miralax Senokot Smooth move tea  Diarrhea: Kaopectate Imodium A-D  *NO pepto Bismol  Hemorrhoids: Anusol Anusol HC Preparation H Tucks  Indigestion: Tums Maalox Mylanta Zantac  Pepcid  Insomnia: Benadryl (alcohol free) 25mg  every 6 hours as needed Tylenol PM Unisom, no Gelcaps  Leg Cramps: Tums MagGel  Nausea/Vomiting:  Bonine Dramamine Emetrol Ginger extract Sea bands Meclizine  Nausea medication to take during pregnancy:  Unisom (doxylamine succinate 25 mg tablets) Take one tablet daily at bedtime. If symptoms are not adequately controlled, the dose can be increased to a maximum recommended dose of two tablets daily (1/2 tablet in the morning, 1/2 tablet mid-afternoon and one at bedtime). Vitamin B6 100mg  tablets. Take one tablet twice a day (up to 200 mg per day).  Skin Rashes: Aveeno products Benadryl cream or 25mg  every 6 hours  as needed Calamine Lotion 1% cortisone cream  Yeast infection: Gyne-lotrimin 7 Monistat 7   **If taking multiple medications, please check labels to avoid duplicating the same active ingredients **take medication as directed on the label ** Do not exceed 4000 mg of tylenol in 24 hours **Do not take medications that contain aspirin or ibuprofen

## 2020-09-17 NOTE — Progress Notes (Signed)
History:   Fusaye Wachtel is a 30 y.o. G2P1001 at [redacted]w[redacted]d by LMP, early ultrasound being seen today for her first obstetrical visit.  Her obstetrical history is significant for none. Patient does intend to breast feed. Pregnancy history fully reviewed.  Pt reports this is a desired and planned pregnancy. Allergies: NKDA Current Medications: PNVs PMH: none. No HTN, DM, asthma. PSH: none. OB Hx: 2012 (NSVD, full-term, no complications) Social Hx: pt does not smoke, drink, or use drugs. Family Hx: none Pt declines flu vaccine..  Patient reports no complaints.      HISTORY: OB History  Gravida Para Term Preterm AB Living  2 1 1  0 0 1  SAB IAB Ectopic Multiple Live Births  0 0 0 0 1    # Outcome Date GA Lbr Len/2nd Weight Sex Delivery Anes PTL Lv  2 Current           1 Term 10/19/10    M Vag-Spont   LIV    Last pap smear was done "a long time ago" and was normal per pt report  Past Medical History:  Diagnosis Date  . Anemia    History reviewed. No pertinent surgical history. History reviewed. No pertinent family history. Social History   Tobacco Use  . Smoking status: Never Smoker  . Smokeless tobacco: Never Used  Vaping Use  . Vaping Use: Never used  Substance Use Topics  . Alcohol use: No  . Drug use: Not Currently    Types: Marijuana    Comment: Stopped when confirmed pregnancy   No Known Allergies Current Outpatient Medications on File Prior to Visit  Medication Sig Dispense Refill  . Prenatal Vit-Fe Fumarate-FA (PRENATAL VITAMIN) 27-0.8 MG TABS Take by mouth.    . promethazine (PHENERGAN) 25 MG tablet Take 1 tablet (25 mg total) by mouth every 6 (six) hours as needed for nausea or vomiting. (Patient not taking: Reported on 09/17/2020) 30 tablet 2   No current facility-administered medications on file prior to visit.    Review of Systems Pertinent items noted in HPI and remainder of comprehensive ROS otherwise negative.  Physical Exam:   Vitals:    09/17/20 1426  BP: 112/75  Pulse: (!) 102  Weight: 97 lb (44 kg)   Fetal Heart Rate (bpm): 164  General: well-developed, well-nourished female in no acute distress  Breasts:  normal appearance, no masses or tenderness bilaterally  Skin: normal coloration and turgor, no rashes  Neurologic: oriented, normal, negative, normal mood  Extremities: normal strength, tone, and muscle mass, ROM of all joints is normal  HEENT PERRLA, extraocular movement intact and sclera clear, anicteric  Neck supple and no masses  Cardiovascular: regular rate and rhythm  Respiratory:  no respiratory distress, normal breath sounds  Abdomen: soft, non-tender; bowel sounds normal; no masses,  no organomegaly  Pelvic: normal external genitalia, no lesions, normal vaginal mucosa, normal vaginal discharge, normal cervix, pap smear done.       Assessment:    Pregnancy: G2P1001 Patient Active Problem List   Diagnosis Date Noted  . Encounter for supervision of normal pregnancy in first trimester 09/10/2020     Plan:    1. Encounter for supervision of other normal pregnancy in first trimester - Culture, OB Urine - Genetic Screening - CBC/D/Plt+RPR+Rh+ABO+Rub Ab... - Cervicovaginal ancillary only( [place]) - Cytology - PAP - 09/12/2020 MFM OB COMP + 14 WK; Future  Initial labs drawn. Continue prenatal vitamins. Problem list reviewed and updated. Genetic Screening discussed, NIPS:  ordered. Ultrasound discussed; fetal anatomic survey: ordered. Anticipatory guidance about prenatal visits given including labs, ultrasounds, and testing. Discussed usage of Babyscripts and virtual visits as additional source of managing and completing prenatal visits in midst of coronavirus and pandemic.   Encouraged to complete MyChart Registration for her ability to review results, send requests, and have questions addressed.  The nature of [place] [facility] with multiple MDs and Advanced  Practice Providers was explained to patient; also emphasized that residents, students are part of our team. Routine obstetric precautions reviewed. Encouraged to seek out care at office or emergency room [facility] preferred) for urgent and/or emergent concerns. Return in about 4 weeks (around 10/15/2020) for in-person LOB/APP OK.    Marylen Ponto, NP  3:03 PM 09/17/2020

## 2020-09-18 LAB — CBC/D/PLT+RPR+RH+ABO+RUB AB...
Antibody Screen: NEGATIVE
Basophils Absolute: 0 10*3/uL (ref 0.0–0.2)
Basos: 0 %
EOS (ABSOLUTE): 0 10*3/uL (ref 0.0–0.4)
Eos: 0 %
HCV Ab: <0.1 s/co ratio (ref 0.0–0.9)
HIV Screen 4th Generation wRfx: NONREACTIVE
Hematocrit: 33.3 % — ABNORMAL LOW (ref 34.0–46.6)
Hemoglobin: 11.3 g/dL (ref 11.1–15.9)
Hepatitis B Surface Ag: NEGATIVE
Immature Grans (Abs): 0.1 10*3/uL (ref 0.0–0.1)
Immature Granulocytes: 1 %
Lymphocytes Absolute: 1.2 10*3/uL (ref 0.7–3.1)
Lymphs: 17 %
MCH: 27.9 pg (ref 26.6–33.0)
MCHC: 33.9 g/dL (ref 31.5–35.7)
MCV: 82 fL (ref 79–97)
Monocytes Absolute: 0.5 10*3/uL (ref 0.1–0.9)
Monocytes: 8 %
Neutrophils Absolute: 5.1 10*3/uL (ref 1.4–7.0)
Neutrophils: 74 %
Platelets: 332 10*3/uL (ref 150–450)
RBC: 4.05 x10E6/uL (ref 3.77–5.28)
RDW: 14.3 % (ref 11.7–15.4)
RPR Ser Ql: NONREACTIVE
Rh Factor: POSITIVE
Rubella Antibodies, IGG: <0.9 index — ABNORMAL LOW (ref >0.99)
WBC: 6.9 10*3/uL (ref 3.4–10.8)

## 2020-09-18 LAB — HCV INTERPRETATION

## 2020-09-19 LAB — CYTOLOGY - PAP: Diagnosis: NEGATIVE

## 2020-09-19 LAB — CERVICOVAGINAL ANCILLARY ONLY
Chlamydia: NEGATIVE
Comment: NEGATIVE
Comment: NEGATIVE
Comment: NORMAL
Neisseria Gonorrhea: NEGATIVE
Trichomonas: POSITIVE — AB

## 2020-09-19 LAB — CULTURE, OB URINE

## 2020-09-19 LAB — URINE CULTURE, OB REFLEX

## 2020-09-23 DIAGNOSIS — Z2839 Other underimmunization status: Secondary | ICD-10-CM | POA: Insufficient documentation

## 2020-09-23 DIAGNOSIS — O09899 Supervision of other high risk pregnancies, unspecified trimester: Secondary | ICD-10-CM | POA: Insufficient documentation

## 2020-09-23 DIAGNOSIS — A599 Trichomoniasis, unspecified: Secondary | ICD-10-CM | POA: Insufficient documentation

## 2020-09-23 NOTE — Progress Notes (Signed)
Good afternoon,  Patient tested positive for trichomonas on NOB labs. Patient does not have MyChart. Please call patient to make aware of diagnosis and send treatment per protocol.  Thank you! Joni Reining

## 2020-09-24 ENCOUNTER — Other Ambulatory Visit: Payer: Self-pay | Admitting: Women's Health

## 2020-09-24 ENCOUNTER — Encounter: Payer: Self-pay | Admitting: Women's Health

## 2020-09-24 DIAGNOSIS — A5901 Trichomonal vulvovaginitis: Secondary | ICD-10-CM

## 2020-09-24 MED ORDER — TINIDAZOLE 500 MG PO TABS: 2.0000 g | ORAL_TABLET | Freq: Every day | ORAL | 0 refills | Status: DC

## 2020-09-24 MED ORDER — METRONIDAZOLE 500 MG PO TABS: 500.0000 mg | ORAL_TABLET | Freq: Two times a day (BID) | ORAL | 0 refills | Status: DC

## 2020-09-24 NOTE — Progress Notes (Signed)
Patient returned call for lab results.  Notified patient of + trich and need for treatment and to notify partner for treatment. Rx routed to pharmacy

## 2020-09-25 ENCOUNTER — Encounter: Payer: Self-pay | Admitting: Women's Health

## 2020-09-27 ENCOUNTER — Encounter: Payer: Self-pay | Admitting: Women's Health

## 2020-09-27 DIAGNOSIS — D573 Sickle-cell trait: Secondary | ICD-10-CM | POA: Insufficient documentation

## 2020-10-15 ENCOUNTER — Other Ambulatory Visit: Payer: Self-pay

## 2020-10-15 ENCOUNTER — Ambulatory Visit (INDEPENDENT_AMBULATORY_CARE_PROVIDER_SITE_OTHER): Payer: Medicaid Other | Admitting: Women's Health

## 2020-10-15 VITALS — BP 118/77 | HR 111 | Wt 97.0 lb

## 2020-10-15 DIAGNOSIS — Z3A14 14 weeks gestation of pregnancy: Secondary | ICD-10-CM

## 2020-10-15 DIAGNOSIS — Z3481 Encounter for supervision of other normal pregnancy, first trimester: Secondary | ICD-10-CM

## 2020-10-15 DIAGNOSIS — O09899 Supervision of other high risk pregnancies, unspecified trimester: Secondary | ICD-10-CM

## 2020-10-15 DIAGNOSIS — Z2839 Other underimmunization status: Secondary | ICD-10-CM

## 2020-10-15 DIAGNOSIS — A599 Trichomoniasis, unspecified: Secondary | ICD-10-CM

## 2020-10-15 DIAGNOSIS — D573 Sickle-cell trait: Secondary | ICD-10-CM

## 2020-10-15 NOTE — Progress Notes (Signed)
Subjective:  Casey Malone is a 30 y.o. G2P1001 at [redacted]w[redacted]d being seen today for ongoing prenatal care.  She is currently monitored for the following issues for this low-risk pregnancy and has Encounter for supervision of normal pregnancy in first trimester; Rubella non-immune status, antepartum; Trichomonas infection; and Sickle cell trait (HCC) on their problem list.  Patient reports no complaints.  Contractions: Not present. Vag. Bleeding: None.   . Denies leaking of fluid.   The following portions of the patient's history were reviewed and updated as appropriate: allergies, current medications, past family history, past medical history, past social history, past surgical history and problem list. Problem list updated.  Objective:   Vitals:   10/15/20 0953  BP: 118/77  Pulse: (!) 111  Weight: 97 lb (44 kg)    Fetal Status: Fetal Heart Rate (bpm): 157 Fundal Height: 14 cm       General:  Alert, oriented and cooperative. Patient is in no acute distress.  Skin: Skin is warm and dry. No rash noted.   Cardiovascular: Normal heart rate noted  Respiratory: Normal respiratory effort, no problems with respiration noted  Abdomen: Soft, gravid, appropriate for gestational age. Pain/Pressure: Present     Pelvic: Vag. Bleeding: None     Cervical exam deferred        Extremities: Normal range of motion.  Edema: None  Mental Status: Normal mood and affect. Normal behavior. Normal judgment and thought content.   Urinalysis:      Assessment and Plan:  Pregnancy: G2P1001 at [redacted]w[redacted]d  1. Encounter for supervision of other normal pregnancy in first trimester [facility]BE info given -AFP next visit  2. Trichomonas infection -pt reports she has not yet picked up the medication, but will do so today  3. Rubella non-immune status, antepartum -vaccine ppartum  4. Sickle cell trait (HCC) -normal UC at [facility]  5. [redacted] weeks gestation of pregnancy  Preterm labor symptoms and general obstetric precautions  including but not limited to vaginal bleeding, contractions, leaking of fluid and fetal movement were reviewed in detail with the patient. I discussed the assessment and treatment plan with the patient. The patient was provided an opportunity to ask questions and all were answered. The patient agreed with the plan and demonstrated an understanding of the instructions. The patient was advised to call back or seek an in-person office evaluation/go to MAU at [facility] for any urgent or concerning symptoms. Please refer to After Visit Summary for other counseling recommendations.  Return in about 4 weeks (around 11/12/2020) for in-person LOB/APP OK.   Zayden Hahne, Odie Sera, NP

## 2020-10-15 NOTE — Patient Instructions (Addendum)
Maternity Assessment Unit (MAU)  The Maternity Assessment Unit (MAU) is located at the [facility] and [place] [facility]. The address is: [address], [place], [place], [place] [address]. Please see map below for additional directions.    The Maternity Assessment Unit is designed to help you during your pregnancy, and for up to 6 weeks after delivery, with any pregnancy- or postpartum-related emergencies, if you think you are in labor, or if your water has broken. For example, if you experience nausea and vomiting, vaginal bleeding, severe abdominal or pelvic pain, elevated blood pressure or other problems related to your pregnancy or postpartum time, please come to the Maternity Assessment Unit for assistance.        Preterm Labor The normal length of a pregnancy is 39-41 weeks. Preterm labor is when labor starts before 37 completed weeks of pregnancy. Babies who are born prematurely and survive may not be fully developed and may be at an increased risk for long-term problems such as cerebral palsy, developmental delays, and vision and hearing problems. Babies who are born too early may have problems soon after birth. Problems may include regulating blood sugar, body temperature, heart rate, and breathing rate. These babies often have trouble with feeding. The risk of having problems is highest for babies who are born before 44 weeks of pregnancy. What are the causes? The exact cause of this condition is not known. What increases the risk? You are more likely to have preterm labor if you have certain risk factors that relate to your medical history, problems with present and past pregnancies, and lifestyle factors. Medical history  You have abnormalities of the uterus, including a short cervix.  You have STIs (sexually transmitted infections), or other infections of the urinary tract and the vagina.  You have chronic illnesses, such as blood clotting problems,  diabetes, or high blood pressure.  You are overweight or underweight. Present and past pregnancies  You have had preterm labor before.  You are pregnant with twins or other multiples.  You have been diagnosed with a condition in which the placenta covers your cervix (placenta previa).  You waited less than 6 months between giving birth and becoming pregnant again.  Your unborn baby has some abnormalities.  You have vaginal bleeding during pregnancy.  You became pregnant through in vitro fertilization (IVF). Lifestyle and environmental factors  You use tobacco products.  You drink alcohol.  You use street drugs.  You have stress and no social support.  You experience domestic violence.  You are exposed to certain chemicals or environmental pollutants. Other factors  You are younger than age 110 or older than age 19. What are the signs or symptoms? Symptoms of this condition include:  Cramps similar to those that can happen during a menstrual period. The cramps may happen with diarrhea.  Pain in the abdomen or lower back.  Regular contractions that may feel like tightening of the abdomen.  A feeling of increased pressure in the pelvis.  Increased watery or bloody mucus discharge from the vagina.  Water breaking (ruptured amniotic sac). How is this diagnosed? This condition is diagnosed based on:  Your medical history and a physical exam.  A pelvic exam.  An ultrasound.  Monitoring your uterus for contractions.  Other tests, including: ? A swab of the cervix to check for a chemical called fetal fibronectin. ? Urine tests. How is this treated? Treatment for this condition depends on the length of your pregnancy, your  condition, and the health of your baby. Treatment may include:  Taking medicines, such as: ? Hormone medicines. These may be given early in pregnancy to help support the pregnancy. ? Medicines to stop contractions. ? Medicines to help mature  the baby's lungs. These may be prescribed if the risk of delivery is high. ? Medicines to prevent your baby from developing cerebral palsy.  Bed rest. If the labor happens before 34 weeks of pregnancy, you may need to stay in the hospital.  Delivery of the baby. Follow these instructions at home:  Do not use any products that contain nicotine or tobacco, such as cigarettes, e-cigarettes, and chewing tobacco. If you need help quitting, ask your health care provider.  Do not drink alcohol.  Take over-the-counter and prescription medicines only as told by your health care provider.  Rest as told by your health care provider.  Return to your normal activities as told by your health care provider. Ask your health care provider what activities are safe for you.  Keep all follow-up visits as told by your health care provider. This is important.   How is this prevented? To increase your chance of having a full-term pregnancy:  Do not use street drugs or medicines that have not been prescribed to you during your pregnancy.  Talk with your health care provider before taking any herbal supplements, even if you have been taking them regularly.  Make sure you gain a healthy amount of weight during your pregnancy.  Watch for infection. If you think that you might have an infection, get it checked right away. Symptoms of infection may include: ? Fever. ? Abnormal vaginal discharge or discharge that smells bad. ? Pain or burning with urination. ? Needing to urinate urgently. ? Frequently urinating or passing small amounts of urine frequently. ? Blood in your urine. ? Urine that smells bad or unusual.  Tell your health care provider if you have had preterm labor before. Contact a health care provider if:  You think you are going into preterm labor.  You have signs or symptoms of preterm labor.  You have symptoms of infection. Get help right away if:  You are having regular, painful  contractions every 5 minutes or less.  Your water breaks. Summary  Preterm labor is labor that starts before you reach 37 weeks of pregnancy.  Delivering your baby early increases your baby's risk of developing lifelong problems.  The exact cause of preterm labor is unknown. However, having an abnormal uterus, an STI (sexually transmitted infection), or vaginal bleeding during pregnancy increases your risk for preterm labor.  Keep all follow-up visits as told by your health care provider. This is important.  Contact a health care provider if you have signs or symptoms of preterm labor. This information is not intended to replace advice given to you by your health care provider. Make sure you discuss any questions you have with your health care provider. Document Revised: 07/10/2019 Document Reviewed: 07/10/2019 Elsevier Patient Education  2021 Elsevier Inc.        Alpha-Fetoprotein Test Why am I having this test? The alpha-fetoprotein test is a lab test most commonly used for pregnant women to help screen for birth defects in their unborn baby. It can be used to screen for chromosome (DNA) abnormalities, problems with the brain or spinal cord, or problems with the abdominal wall of the unborn baby (fetus). The alpha-fetoprotein test may also be done for men or nonpregnant women to check for certain  cancers. What is being tested? This test measures the amount of alpha-fetoprotein (AFP) in your blood. AFP is a protein that is made by the liver. Levels can be detected in the mother's blood during pregnancy, starting at 10 weeks and peaking at 16-18 weeks of the pregnancy. Abnormal levels can sometimes be a sign of a birth defect in the baby. Certain cancers can cause a high level of AFP in men and nonpregnant women. What kind of sample is taken? A blood sample is required for this test. It is usually collected by inserting a needle into a blood vessel.   How are the results  reported? Your test results will be reported as values. Your health care provider will compare your results to normal ranges that were established after testing a large group of people (reference values). Reference values may vary among labs and hospitals. For this test, common reference values are:  Adult: Less than 40 ng/mL or less than 40 mcg/L (SI units).  Child younger than 1 year: Less than 30 ng/mL. If you are pregnant, the values may also vary based on how long you have been pregnant. What do the results mean? Results that are above the reference values in pregnant women may indicate the following for the baby:  Neural tube defects, such as abnormalities of the spinal cord or brain.  Abdominal wall defects.  Multiple pregnancy such as twins.  Fetal distress or fetal death. Results that are above the reference values in men or nonpregnant women may indicate:  Reproductive cancers, such as ovarian or testicular cancer.  Liver cancer.  Liver cell death.  Other types of cancer. Very low levels of AFP in pregnant women may indicate Down syndrome for the baby. Talk with your health care provider about what your results mean. Questions to ask your health care provider Ask your health care provider, or the department that is doing the test:  When will my results be ready?  How will I get my results?  What are my treatment options?  What other tests do I need?  What are my next steps? Summary  The alpha-fetoprotein test is done on pregnant women to help screen for birth defects in their unborn baby.  Certain cancers can cause a high level of AFP in men and nonpregnant women.  For this test, a blood sample is usually collected by inserting a needle into a blood vessel.  Talk with your health care provider about what your results mean. This information is not intended to replace advice given to you by your health care provider. Make sure you discuss any questions you  have with your health care provider. Document Revised: 12/28/2019 Document Reviewed: 12/28/2019 Elsevier Patient Education  2021 Elsevier Inc.        Second Trimester of Pregnancy  The second trimester of pregnancy is from week 13 through week 27. This is months 4 through 6 of pregnancy. The second trimester is often a time when you feel your best. Your body has adjusted to being pregnant, and you begin to feel better physically. During the second trimester:  Morning sickness has lessened or stopped completely.  You may have more energy.  You may have an increase in appetite. The second trimester is also a time when the unborn baby (fetus) is growing rapidly. At the end of the sixth month, the fetus may be up to 12 inches long and weigh about 1 pounds. You will likely begin to feel the baby move (quickening) between  16 and 20 weeks of pregnancy. Body changes during your second trimester Your body continues to go through many changes during your second trimester. The changes vary and generally return to normal after the baby is born. Physical changes  Your weight will continue to increase. You will notice your lower abdomen bulging out.  You may begin to get stretch marks on your hips, abdomen, and breasts.  Your breasts will continue to grow and to become tender.  Dark spots or blotches (chloasma or mask of pregnancy) may develop on your face.  A dark line from your belly button to the pubic area (linea nigra) may appear.  You may have changes in your hair. These can include thickening of your hair, rapid growth, and changes in texture. Some people also have hair loss during or after pregnancy, or hair that feels dry or thin. Health changes  You may develop headaches.  You may have heartburn.  You may develop constipation.  You may develop hemorrhoids or swollen, bulging veins (varicose veins).  Your gums may bleed and may be sensitive to brushing and flossing.  You  may urinate more often because the fetus is pressing on your bladder.  You may have back pain. This is caused by: ? Weight gain. ? Pregnancy hormones that are relaxing the joints in your pelvis. ? A shift in weight and the muscles that support your balance. Follow these instructions at home: Medicines  Follow your health care provider's instructions regarding medicine use. Specific medicines may be either safe or unsafe to take during pregnancy. Do not take any medicines unless approved by your health care provider.  Take a prenatal vitamin that contains at least 600 micrograms (mcg) of folic acid. Eating and drinking  Eat a healthy diet that includes fresh fruits and vegetables, whole grains, good sources of protein such as meat, eggs, or tofu, and low-fat dairy products.  Avoid raw meat and unpasteurized juice, milk, and cheese. These carry germs that can harm you and your baby.  You may need to take these actions to prevent or treat constipation: ? Drink enough fluid to keep your urine pale yellow. ? Eat foods that are high in fiber, such as beans, whole grains, and fresh fruits and vegetables. ? Limit foods that are high in fat and processed sugars, such as fried or sweet foods. Activity  Exercise only as directed by your health care provider. Most people can continue their usual exercise routine during pregnancy. Try to exercise for 30 minutes at least 5 days a week. Stop exercising if you develop contractions in your uterus.  Stop exercising if you develop pain or cramping in the lower abdomen or lower back.  Avoid exercising if it is very hot or humid or if you are at a high altitude.  Avoid heavy lifting.  If you choose to, you may have sex unless your health care provider tells you not to. Relieving pain and discomfort  Wear a supportive bra to prevent discomfort from breast tenderness.  Take warm sitz baths to soothe any pain or discomfort caused by hemorrhoids. Use  hemorrhoid cream if your health care provider approves.  Rest with your legs raised (elevated) if you have leg cramps or low back pain.  If you develop varicose veins: ? Wear support hose as told by your health care provider. ? Elevate your feet for 15 minutes, 3-4 times a day. ? Limit salt in your diet. Safety  Wear your seat belt at all times when  driving or riding in a car.  Talk with your health care provider if someone is verbally or physically abusive to you. Lifestyle  Do not use hot tubs, steam rooms, or saunas.  Do not douche. Do not use tampons or scented sanitary pads.  Avoid cat litter boxes and soil used by cats. These carry germs that can cause birth defects in the baby and possibly loss of the fetus by miscarriage or stillbirth.  Do not use herbal remedies, alcohol, illegal drugs, or medicines that are not approved by your health care provider. Chemicals in these products can harm your baby.  Do not use any products that contain nicotine or tobacco, such as cigarettes, e-cigarettes, and chewing tobacco. If you need help quitting, ask your health care provider. General instructions  During a routine prenatal visit, your health care provider will do a physical exam and other tests. He or she will also discuss your overall health. Keep all follow-up visits. This is important.  Ask your health care provider for a referral to a local prenatal education class.  Ask for help if you have counseling or nutritional needs during pregnancy. Your health care provider can offer advice or refer you to specialists for help with various needs. Where to find more information  American Pregnancy Association: americanpregnancy.org  [employer]necologists: https://www.todd-brady.net/  Office on [employer] Health: Mighty[employer]ard.[employer].nz Contact a health care provider if you have:  A headache that does not go away when you take  medicine.  Vision changes or you see spots in front of your eyes.  Mild pelvic cramps, pelvic pressure, or nagging pain in the abdominal area.  Persistent nausea, vomiting, or diarrhea.  A bad-smelling vaginal discharge or foul-smelling urine.  Pain when you urinate.  Sudden or extreme swelling of your face, hands, ankles, feet, or legs.  A fever. Get help right away if you:  Have fluid leaking from your vagina.  Have spotting or bleeding from your vagina.  Have severe abdominal cramping or pain.  Have difficulty breathing.  Have chest pain.  Have fainting spells.  Have not felt your baby move for the time period told by your health care provider.  Have new or increased pain, swelling, or redness in an arm or leg. Summary  The second trimester of pregnancy is from week 13 through week 27 (months 4 through 6).  Do not use herbal remedies, alcohol, illegal drugs, or medicines that are not approved by your health care provider. Chemicals in these products can harm your baby.  Exercise only as directed by your health care provider. Most people can continue their usual exercise routine during pregnancy.  Keep all follow-up visits. This is important. This information is not intended to replace advice given to you by your health care provider. Make sure you discuss any questions you have with your health care provider. Document Revised: 11/14/2019 Document Reviewed: 09/20/2019 Elsevier Patient Education  2021 Elsevier Inc.        Round Ligament Pain  The round ligament is a cord of muscle and tissue that helps support the uterus. It can become a source of pain during pregnancy if it becomes stretched or twisted as the baby grows. The pain usually begins in the second trimester (13-28 weeks) of pregnancy, and it can come and go until the baby is delivered. It is not a serious problem, and it does not cause harm to the baby. Round ligament pain is usually a short, sharp,  and pinching pain,  but it can also be a dull, lingering, and aching pain. The pain is felt in the lower side of the abdomen or in the groin. It usually starts deep in the groin and moves up to the outside of the hip area. The pain may occur when you:  Suddenly change position, such as quickly going from a sitting to standing position.  Roll over in bed.  Cough or sneeze.  Do physical activity. Follow these instructions at home:  Watch your condition for any changes.  When the pain starts, relax. Then try any of these methods to help with the pain: ? Sitting down. ? Flexing your knees up to your abdomen. ? Lying on your side with one pillow under your abdomen and another pillow between your legs. ? Sitting in a warm bath for 15-20 minutes or until the pain goes away.  Take over-the-counter and prescription medicines only as told by your health care provider.  Move slowly when you sit down or stand up.  Avoid long walks if they cause pain.  Stop or reduce your physical activities if they cause pain.  Keep all follow-up visits as told by your health care provider. This is important.   Contact a health care provider if:  Your pain does not go away with treatment.  You feel pain in your back that you did not have before.  Your medicine is not helping. Get help right away if:  You have a fever or chills.  You develop uterine contractions.  You have vaginal bleeding.  You have nausea or vomiting.  You have diarrhea.  You have pain when you urinate. Summary  Round ligament pain is felt in the lower abdomen or groin. It is usually a short, sharp, and pinching pain. It can also be a dull, lingering, and aching pain.  This pain usually begins in the second trimester (13-28 weeks). It occurs because the uterus is stretching with the growing baby, and it is not harmful to the baby.  You may notice the pain when you suddenly change position, when you cough or sneeze, or during  physical activity.  Relaxing, flexing your knees to your abdomen, lying on one side, or taking a warm bath may help to get rid of the pain.  Get help from your health care provider if the pain does not go away or if you have vaginal bleeding, nausea, vomiting, diarrhea, or painful urination. This information is not intended to replace advice given to you by your health care provider. Make sure you discuss any questions you have with your health care provider. Document Revised: 11/23/2017 Document Reviewed: 11/23/2017 Elsevier Patient Education  2021 Elsevier Inc.       Childbirth Education Options: [facility] Department Classes:  Childbirth education classes can help you get ready for a positive parenting experience. You can also meet other expectant parents and get free stuff for your baby. Each class runs for five weeks on the same night and costs $45 for the mother-to-be and her support person. Medicaid covers the cost if you are eligible. Call [phone] to register. [facility] Childbirth Education:  [phone] or 534-841-1902 or sophia.law@[place] .com  Baby & Me Class: Discuss newborn & infant parenting and family adjustment issues with other new mothers in a relaxed environment. Each week brings a new speaker or baby-centered activity. We encourage new mothers to join [place] every Thursday at 11:00am. Babies birth until crawling. No registration or fee. Daddy [employer]: This course offers Dads-to-be the  tools and knowledge needed to feel confident on their journey to becoming new fathers. Experienced dads, who have been trained as coaches, teach dads-to-be how to hold, comfort, diaper, swaddle and play with their infant while being able to support the new mom as well. A class for men taught by men. $25/dad Big Brother/Big Sister: Let your children share in the joy of a new brother or sister in this special class designed just for them. Class includes discussion about  how families care for babies: swaddling, holding, diapering, safety as well as how they can be helpful in their new role. This class is designed for children ages 2 to 526, but any age is welcome. Please register each child individually. $5/child  Mom Talk: This mom-led group offers support and connection to mothers as they journey through the adjustments and struggles of that sometimes overwhelming first year after the birth of a child. Tuesdays at 10:00am and Thursdays at 6:00pm. Babies welcome. No registration or fee. Breastfeeding Support Group: This group is a mother-to-mother support circle where moms have the opportunity to share their breastfeeding experiences. A Lactation Consultant is present for questions and concerns. Meets each Tuesday at 11:00am. No fee or registration. Breastfeeding Your Baby: Learn what to expect in the first days of breastfeeding your newborn.  This class will help you feel more confident with the skills needed to begin your breastfeeding experience. Many new mothers are concerned about breastfeeding after leaving the hospital. This class will also address the most common fears and challenges about breastfeeding during the first few weeks, months and beyond. (call for fee) Comfort Techniques and Tour: This 2 hour interactive class will provide you the opportunity to learn & practice hands-on techniques that can help relieve some of the discomfort of labor and encourage your baby to rotate toward the best position for birth. You and your partner will be able to try a variety of labor positions with birth balls and rebozos as well as practice breathing, relaxation, and visualization techniques. A tour of the [facility] is included with this class. $20 per registrant and support person Childbirth Class- Weekend Option: This class is a Weekend version of our Birth & Baby series. It is designed for parents who have a difficult time fitting several weeks of  classes into their schedule. It covers the care of your newborn and the basics of labor and childbirth. It also includes a Maternity Care Center Tour of [facility] and lunch. The class is held two consecutive days: beginning on Friday evening from 6:30 - 8:30 p.m. and the next day, Saturday from 9 a.m. - 4 p.m. (call for fee) [facility] DolinWaterbirth Class: Interested in a waterbirth?  This informational class will help you discover whether waterbirth is the right fit for you. Education about waterbirth itself, supplies you would need and how to assemble your support team is what you can expect from this class. Some obstetrical practices require this class in order to pursue a waterbirth. (Not all obstetrical practices offer waterbirth-check with your healthcare provider.) Register only the expectant mom, but you are encouraged to bring your partner to class! Required if planning waterbirth, no fee. Infant/Child CPR: Parents, grandparents, babysitters, and friends learn Cardio-Pulmonary Resuscitation skills for infants and children. You will also learn how to treat both conscious and unconscious choking in infants and children. This Family & Friends program does not offer certification. Register each participant individually to ensure that enough mannequins are available. (Call for fee) Grandparent  Love: Expecting a grandbaby? This class is for you! Learn about the latest infant care and safety recommendations and ways to support your own child as he or she transitions into the parenting role. Taught by Registered Nurses who are childbirth instructors, but most importantly...they are grandmothers too! $10/person. Childbirth Class- Natural Childbirth: This series of 5 weekly classes is for expectant parents who want to learn and practice natural methods of coping with the process of labor and childbirth. Relaxation, breathing, massage, visualization, role of the partner, and helpful positioning are highlighted.  Participants learn how to be confident in their body's ability to give birth. This class will empower and help parents make informed decisions about their own care. Includes discussion that will help new parents transition into the immediate postpartum period. Maternity Care Center Tour of [facility] is included. We suggest taking this class between 25-32 weeks, but it's only a recommendation. $75 per registrant and one support person or $30 Medicaid. Childbirth Class- 3 week Series: This option of 3 weekly classes helps you and your labor partner prepare for childbirth. Newborn care, labor & birth, cesarean birth, pain management, and comfort techniques are discussed and a Maternity Care Center Tour of [facility] is included. The class meets at the same time, on the same day of the week for 3 consecutive weeks beginning with the starting date you choose. $60 for registrant and one support person.  Marvelous Multiples: Expecting twins, triplets, or more? This class covers the differences in labor, birth, parenting, and breastfeeding issues that face multiples' parents. NICU tour is included. Led by a Certified Childbirth Educator who is the mother of twins. No fee. Caring for Baby: This class is for expectant and adoptive parents who want to learn and practice the most up-to-date newborn care for their babies. Focus is on birth through the first six weeks of life. Topics include feeding, bathing, diapering, crying, umbilical cord care, circumcision care and safe sleep. Parents learn to recognize symptoms of illness and when to call the pediatrician. Register only the mom-to-be and your partner or support person can plan to come with you! $10 per registrant and support person Childbirth Class- online option: This online class offers you the freedom to complete a Birth and Baby series in the comfort of your own home. The flexibility of this option allows you to review sections at your own pace, at  times convenient to you and your support people. It includes additional video information, animations, quizzes, and extended activities. Get organized with helpful eClass tools, checklists, and trackers. Once you register online for the class, you will receive an email within a few days to accept the invitation and begin the class when the time is right for you. The content will be available to you for 60 days. $60 for 60 days of online access for you and your support people.

## 2020-10-15 NOTE — Progress Notes (Signed)
ROB tested +Trich 09/17/20, she has not picked her RX yet, stated she will do so today.

## 2020-11-12 ENCOUNTER — Ambulatory Visit (INDEPENDENT_AMBULATORY_CARE_PROVIDER_SITE_OTHER): Payer: Medicaid Other | Admitting: Obstetrics and Gynecology

## 2020-11-12 ENCOUNTER — Encounter: Payer: Self-pay | Admitting: Obstetrics and Gynecology

## 2020-11-12 ENCOUNTER — Other Ambulatory Visit: Payer: Self-pay

## 2020-11-12 VITALS — BP 109/74 | HR 105 | Wt 103.0 lb

## 2020-11-12 DIAGNOSIS — A599 Trichomoniasis, unspecified: Secondary | ICD-10-CM

## 2020-11-12 DIAGNOSIS — A5901 Trichomonal vulvovaginitis: Secondary | ICD-10-CM

## 2020-11-12 DIAGNOSIS — O09899 Supervision of other high risk pregnancies, unspecified trimester: Secondary | ICD-10-CM

## 2020-11-12 DIAGNOSIS — Z2839 Other underimmunization status: Secondary | ICD-10-CM

## 2020-11-12 DIAGNOSIS — Z3481 Encounter for supervision of other normal pregnancy, first trimester: Secondary | ICD-10-CM

## 2020-11-12 DIAGNOSIS — D573 Sickle-cell trait: Secondary | ICD-10-CM

## 2020-11-12 DIAGNOSIS — O23591 Infection of other part of genital tract in pregnancy, first trimester: Secondary | ICD-10-CM

## 2020-11-12 MED ORDER — METRONIDAZOLE 500 MG PO TABS: 500.0000 mg | ORAL_TABLET | Freq: Two times a day (BID) | ORAL | 0 refills | Status: DC

## 2020-11-12 NOTE — Progress Notes (Signed)
Subjective:  Casey Malone is a 30 y.o. G2P1001 at [redacted]w[redacted]d being seen today for ongoing prenatal care.  She is currently monitored for the following issues for this low-risk pregnancy and has Encounter for supervision of normal pregnancy in first trimester; Rubella non-immune status, antepartum; Trichomonas infection; and Sickle cell trait (HCC) on their problem list.  Patient reports no complaints.  Contractions: Not present. Vag. Bleeding: None.  Movement: Present. Denies leaking of fluid.   The following portions of the patient's history were reviewed and updated as appropriate: allergies, current medications, past family history, past medical history, past social history, past surgical history and problem list. Problem list updated.  Objective:   Vitals:   11/12/20 0951  BP: 109/74  Pulse: (!) 105  Weight: 103 lb (46.7 kg)    Fetal Status: Fetal Heart Rate (bpm): 150   Movement: Present     General:  Alert, oriented and cooperative. Patient is in no acute distress.  Skin: Skin is warm and dry. No rash noted.   Cardiovascular: Normal heart rate noted  Respiratory: Normal respiratory effort, no problems with respiration noted  Abdomen: Soft, gravid, appropriate for gestational age. Pain/Pressure: Absent     Pelvic:  Cervical exam deferred        Extremities: Normal range of motion.     Mental Status: Normal mood and affect. Normal behavior. Normal judgment and thought content.   Urinalysis:      Assessment and Plan:  Pregnancy: G2P1001 at [redacted]w[redacted]d  1. Trichomonal vaginitis during pregnancy in first trimester Still has not picked up Rx. Importance of treatment reviewed with pt Rx sent to new pharmacy per pt request - metroNIDAZOLE (FLAGYL) 500 MG tablet; Take 1 tablet (500 mg total) by mouth 2 (two) times daily.  Dispense: 14 tablet; Refill: 0  2. Encounter for supervision of other normal pregnancy in first trimester Stable Anatomy scan next week  3. Rubella non-immune  status, antepartum Vaccine PP  4. Trichomonas infection See above - metroNIDAZOLE (FLAGYL) 500 MG tablet; Take 1 tablet (500 mg total) by mouth 2 (two) times daily.  Dispense: 14 tablet; Refill: 0  5. Sickle cell trait (HCC) UC with next OB visit  Preterm labor symptoms and general obstetric precautions including but not limited to vaginal bleeding, contractions, leaking of fluid and fetal movement were reviewed in detail with the patient. Please refer to After Visit Summary for other counseling recommendations.  Return in about 4 weeks (around 12/10/2020) for OB visit, face to face, any provider.   Hermina Staggers, MD

## 2020-11-12 NOTE — Progress Notes (Signed)
Pt states she has not yet taken Rx for +Trich, please resend Rx to new pharmacy today.

## 2020-11-12 NOTE — Patient Instructions (Signed)

## 2020-11-18 ENCOUNTER — Ambulatory Visit: Payer: Medicaid Other

## 2020-12-10 ENCOUNTER — Ambulatory Visit: Payer: Medicaid Other | Attending: Women's Health

## 2020-12-10 ENCOUNTER — Encounter: Payer: Medicaid Other | Admitting: Women's Health

## 2020-12-10 ENCOUNTER — Other Ambulatory Visit: Payer: Self-pay | Admitting: Women's Health

## 2020-12-10 ENCOUNTER — Other Ambulatory Visit: Payer: Self-pay

## 2020-12-10 DIAGNOSIS — Z3481 Encounter for supervision of other normal pregnancy, first trimester: Secondary | ICD-10-CM | POA: Diagnosis not present

## 2020-12-10 DIAGNOSIS — O352XX0 Maternal care for (suspected) hereditary disease in fetus, not applicable or unspecified: Secondary | ICD-10-CM | POA: Diagnosis not present

## 2020-12-10 DIAGNOSIS — Z363 Encounter for antenatal screening for malformations: Secondary | ICD-10-CM | POA: Insufficient documentation

## 2020-12-10 DIAGNOSIS — Z3A22 22 weeks gestation of pregnancy: Secondary | ICD-10-CM | POA: Diagnosis not present

## 2020-12-10 DIAGNOSIS — D573 Sickle-cell trait: Secondary | ICD-10-CM | POA: Insufficient documentation

## 2020-12-17 ENCOUNTER — Other Ambulatory Visit: Payer: Self-pay

## 2020-12-17 DIAGNOSIS — A599 Trichomoniasis, unspecified: Secondary | ICD-10-CM

## 2020-12-17 DIAGNOSIS — A5901 Trichomonal vulvovaginitis: Secondary | ICD-10-CM

## 2020-12-31 ENCOUNTER — Encounter: Payer: Medicaid Other | Admitting: Family Medicine

## 2021-01-07 ENCOUNTER — Ambulatory Visit (INDEPENDENT_AMBULATORY_CARE_PROVIDER_SITE_OTHER): Payer: Medicaid Other | Admitting: Family Medicine

## 2021-01-07 ENCOUNTER — Encounter: Payer: Self-pay | Admitting: Family Medicine

## 2021-01-07 ENCOUNTER — Other Ambulatory Visit: Payer: Self-pay

## 2021-01-07 ENCOUNTER — Other Ambulatory Visit (HOSPITAL_COMMUNITY)
Admission: RE | Admit: 2021-01-07 | Discharge: 2021-01-07 | Disposition: A | Discharge status: Home / Self Care | Payer: Medicaid Other | Source: Ambulatory Visit | Attending: Women's Health | Admitting: Women's Health

## 2021-01-07 VITALS — BP 120/72 | HR 120 | Wt 108.0 lb

## 2021-01-07 DIAGNOSIS — A599 Trichomoniasis, unspecified: Secondary | ICD-10-CM

## 2021-01-07 DIAGNOSIS — Z3481 Encounter for supervision of other normal pregnancy, first trimester: Secondary | ICD-10-CM

## 2021-01-07 DIAGNOSIS — Z2839 Other underimmunization status: Secondary | ICD-10-CM

## 2021-01-07 NOTE — Progress Notes (Signed)
ROB, she is concerned about the size of the baby as she was much bigger with her son.

## 2021-01-07 NOTE — Progress Notes (Signed)
   Subjective:  Casey Malone is a 30 y.o. G2P1001 at [redacted]w[redacted]d being seen today for ongoing prenatal care.  She is currently monitored for the following issues for this low-risk pregnancy and has Encounter for supervision of normal pregnancy in first trimester; Rubella non-immune status, antepartum; Trichomonas infection; and Sickle cell trait ([place]) on their problem list.  Patient reports no complaints.  Contractions: Not present. Vag. Bleeding: None.  Movement: Present. Denies leaking of fluid.   The following portions of the patient's history were reviewed and updated as appropriate: allergies, current medications, past family history, past medical history, past social history, past surgical history and problem list. Problem list updated.  Objective:   Vitals:   01/07/21 0853  BP: 120/72  Pulse: (!) 120  Weight: 108 lb (49 kg)    Fetal Status: Fetal Heart Rate (bpm): 157   Movement: Present     General:  Alert, oriented and cooperative. Patient is in no acute distress.  Skin: Skin is warm and dry. No rash noted.   Cardiovascular: Normal heart rate noted  Respiratory: Normal respiratory effort, no problems with respiration noted  Abdomen: Soft, gravid, appropriate for gestational age. Pain/Pressure: Absent     Pelvic: Vag. Bleeding: None     Cervical exam deferred        Extremities: Normal range of motion.  Edema: None  Mental Status: Normal mood and affect. Normal behavior. Normal judgment and thought content.   Urinalysis:      Assessment and Plan:  Pregnancy: G2P1001 at [redacted]w[redacted]d  1. Encounter for supervision of other normal pregnancy in first trimester BP and FHR normal Discussed need for 28wk labs next visit, to come fasting  2. Trichomonas infection Reports she took four days of treatment TOC today though doubtful it has been effective  3. Rubella non-immune status, antepartum Offer MMR PP  Preterm labor symptoms and general obstetric precautions including but not  limited to vaginal bleeding, contractions, leaking of fluid and fetal movement were reviewed in detail with the patient. Please refer to After Visit Summary for other counseling recommendations.  Return in 2 weeks (on 01/21/2021) for ob visit, 28 wk labs.   Clarnce Flock, MD

## 2021-01-07 NOTE — Patient Instructions (Signed)

## 2021-01-08 LAB — CERVICOVAGINAL ANCILLARY ONLY
Chlamydia: NEGATIVE
Comment: NEGATIVE
Comment: NEGATIVE
Comment: NORMAL
Neisseria Gonorrhea: NEGATIVE
Trichomonas: POSITIVE — AB

## 2021-01-11 MED ORDER — METRONIDAZOLE 500 MG PO TABS: 2000.0000 mg | ORAL_TABLET | Freq: Once | ORAL | 0 refills | Status: AC

## 2021-01-11 NOTE — Addendum Note (Signed)
Addended by: Merian Capron on: 01/11/2021 08:10 PM   Modules accepted: Orders

## 2021-01-21 ENCOUNTER — Encounter: Payer: Medicaid Other | Admitting: Obstetrics

## 2021-01-21 ENCOUNTER — Other Ambulatory Visit: Payer: Medicaid Other

## 2021-01-30 ENCOUNTER — Other Ambulatory Visit: Payer: Medicaid Other

## 2021-01-30 ENCOUNTER — Encounter: Payer: Medicaid Other | Admitting: Obstetrics

## 2021-02-19 ENCOUNTER — Other Ambulatory Visit: Payer: Self-pay

## 2021-02-19 ENCOUNTER — Ambulatory Visit (INDEPENDENT_AMBULATORY_CARE_PROVIDER_SITE_OTHER): Payer: Medicaid Other | Admitting: Obstetrics

## 2021-02-19 ENCOUNTER — Encounter: Payer: Self-pay | Admitting: Obstetrics

## 2021-02-19 ENCOUNTER — Other Ambulatory Visit: Payer: Medicaid Other

## 2021-02-19 VITALS — BP 106/72 | HR 109 | Wt 116.0 lb

## 2021-02-19 DIAGNOSIS — Z3481 Encounter for supervision of other normal pregnancy, first trimester: Secondary | ICD-10-CM

## 2021-02-19 DIAGNOSIS — M549 Dorsalgia, unspecified: Secondary | ICD-10-CM

## 2021-02-19 MED ORDER — CYCLOBENZAPRINE HCL 10 MG PO TABS
10.0000 mg | ORAL_TABLET | Freq: Three times a day (TID) | ORAL | 1 refills | Status: AC | PRN
Start: 2021-02-19 — End: ?

## 2021-02-19 NOTE — Progress Notes (Signed)
Subjective:  Casey Malone is a 30 y.o. G2P1001 at [redacted]w[redacted]d being seen today for ongoing prenatal care.  She is currently monitored for the following issues for this low-risk pregnancy and has Encounter for supervision of normal pregnancy in first trimester; Rubella non-immune status, antepartum; Trichomonas infection; and Sickle cell trait (HCC) on their problem list.  Patient reports  upper back pain .  Contractions: Not present. Vag. Bleeding: None.  Movement: Present. Denies leaking of fluid.   The following portions of the patient's history were reviewed and updated as appropriate: allergies, current medications, past family history, past medical history, past social history, past surgical history and problem list. Problem list updated.  Objective:   Vitals:   02/19/21 1056  BP: 106/72  Pulse: (!) 109  Weight: 116 lb (52.6 kg)    Fetal Status:     Movement: Present     General:  Alert, oriented and cooperative. Patient is in no acute distress.  Skin: Skin is warm and dry. No rash noted.   Cardiovascular: Normal heart rate noted  Respiratory: Normal respiratory effort, no problems with respiration noted  Abdomen: Soft, gravid, appropriate for gestational age. Pain/Pressure: Absent   Fundal Height = 29 cm   Pelvic:  Cervical exam deferred        Extremities: Normal range of motion.     Mental Status: Normal mood and affect. Normal behavior. Normal judgment and thought content.   Urinalysis:      Assessment and Plan:  Pregnancy: G2P1001 at [redacted]w[redacted]d  1. Encounter for supervision of other normal pregnancy in first trimester Rx: - Glucose Tolerance, 2 Hours w/1 Hour - CBC - HIV Antibody (routine testing w rflx) - RPR  2. Backache symptom Rx: - cyclobenzaprine (FLEXERIL) 10 MG tablet; Take 1 tablet (10 mg total) by mouth every 8 (eight) hours as needed for muscle spasms.  Dispense: 30 tablet; Refill: 1  3. SGA (small for gestational age) Rx: - [place] MFM OB FOLLOW UP;  Future    Preterm labor symptoms and general obstetric precautions including but not limited to vaginal bleeding, contractions, leaking of fluid and fetal movement were reviewed in detail with the patient. Please refer to After Visit Summary for other counseling recommendations.   Return in about 2 weeks (around 03/05/2021) for ROB.   Brock Bad, MD  02/19/21

## 2021-02-19 NOTE — Progress Notes (Signed)
Pt is having some left upper back pain.

## 2021-02-20 LAB — CBC
Hematocrit: 29 % — ABNORMAL LOW (ref 34.0–46.6)
Hemoglobin: 10.3 g/dL — ABNORMAL LOW (ref 11.1–15.9)
MCH: 28.2 pg (ref 26.6–33.0)
MCHC: 35.5 g/dL (ref 31.5–35.7)
MCV: 80 fL (ref 79–97)
Platelets: 304 10*3/uL (ref 150–450)
RBC: 3.65 x10E6/uL — ABNORMAL LOW (ref 3.77–5.28)
RDW: 13.2 % (ref 11.7–15.4)
WBC: 8.4 10*3/uL (ref 3.4–10.8)

## 2021-02-20 LAB — HIV ANTIBODY (ROUTINE TESTING W REFLEX): HIV Screen 4th Generation wRfx: NONREACTIVE

## 2021-02-20 LAB — GLUCOSE TOLERANCE, 2 HOURS W/ 1HR
Glucose, 1 hour: 203 mg/dL — ABNORMAL HIGH (ref 65–179)
Glucose, 2 hour: 233 mg/dL — ABNORMAL HIGH (ref 65–152)
Glucose, Fasting: 59 mg/dL — ABNORMAL LOW (ref 65–91)

## 2021-02-20 LAB — RPR: RPR Ser Ql: NONREACTIVE

## 2021-02-21 ENCOUNTER — Other Ambulatory Visit: Payer: Self-pay | Admitting: Obstetrics

## 2021-02-21 DIAGNOSIS — O99019 Anemia complicating pregnancy, unspecified trimester: Secondary | ICD-10-CM

## 2021-02-21 DIAGNOSIS — O24410 Gestational diabetes mellitus in pregnancy, diet controlled: Secondary | ICD-10-CM

## 2021-02-21 MED ORDER — IRON POLYSACCH CMPLX-B12-FA 150-0.025-1 MG PO CAPS: 1.0000 | ORAL_CAPSULE | ORAL | 6 refills | Status: AC

## 2021-02-24 ENCOUNTER — Other Ambulatory Visit: Payer: Self-pay | Admitting: *Deleted

## 2021-02-24 DIAGNOSIS — O24419 Gestational diabetes mellitus in pregnancy, unspecified control: Secondary | ICD-10-CM

## 2021-02-24 MED ORDER — ACCU-CHEK SOFTCLIX LANCETS MISC: 5 refills | Status: DC

## 2021-02-24 MED ORDER — ACCU-CHEK GUIDE VI STRP: ORAL_STRIP | 5 refills | Status: DC

## 2021-02-24 MED ORDER — ACCU-CHEK GUIDE W/DEVICE KIT: PACK | 0 refills | Status: DC

## 2021-02-27 ENCOUNTER — Other Ambulatory Visit: Payer: Self-pay | Admitting: *Deleted

## 2021-02-27 ENCOUNTER — Ambulatory Visit: Payer: Medicaid Other | Admitting: *Deleted

## 2021-02-27 ENCOUNTER — Other Ambulatory Visit: Payer: Self-pay | Admitting: Obstetrics

## 2021-02-27 ENCOUNTER — Other Ambulatory Visit: Payer: Self-pay

## 2021-02-27 ENCOUNTER — Ambulatory Visit: Payer: Medicaid Other | Attending: Obstetrics

## 2021-02-27 ENCOUNTER — Encounter: Payer: Self-pay | Admitting: *Deleted

## 2021-02-27 VITALS — BP 113/69 | HR 95

## 2021-02-27 DIAGNOSIS — Z3481 Encounter for supervision of other normal pregnancy, first trimester: Secondary | ICD-10-CM

## 2021-02-27 DIAGNOSIS — A599 Trichomoniasis, unspecified: Secondary | ICD-10-CM

## 2021-02-27 DIAGNOSIS — O24419 Gestational diabetes mellitus in pregnancy, unspecified control: Secondary | ICD-10-CM | POA: Diagnosis not present

## 2021-02-27 DIAGNOSIS — Z3A34 34 weeks gestation of pregnancy: Secondary | ICD-10-CM

## 2021-02-27 DIAGNOSIS — O365930 Maternal care for other known or suspected poor fetal growth, third trimester, not applicable or unspecified: Secondary | ICD-10-CM

## 2021-02-27 DIAGNOSIS — D573 Sickle-cell trait: Secondary | ICD-10-CM | POA: Insufficient documentation

## 2021-02-27 DIAGNOSIS — Z2839 Other underimmunization status: Secondary | ICD-10-CM

## 2021-03-03 NOTE — Progress Notes (Signed)
Patient is scheduled for follow up appointments with MFM.

## 2021-03-05 ENCOUNTER — Other Ambulatory Visit: Payer: Self-pay | Admitting: *Deleted

## 2021-03-05 ENCOUNTER — Encounter: Payer: Medicaid Other | Admitting: Obstetrics

## 2021-03-05 DIAGNOSIS — O365930 Maternal care for other known or suspected poor fetal growth, third trimester, not applicable or unspecified: Secondary | ICD-10-CM

## 2021-03-06 ENCOUNTER — Ambulatory Visit: Payer: Medicaid Other

## 2021-03-06 ENCOUNTER — Ambulatory Visit: Payer: Medicaid Other | Attending: Obstetrics and Gynecology

## 2021-03-11 ENCOUNTER — Ambulatory Visit: Payer: Medicaid Other | Attending: Obstetrics and Gynecology

## 2021-03-11 ENCOUNTER — Ambulatory Visit: Payer: Medicaid Other

## 2021-03-17 ENCOUNTER — Other Ambulatory Visit: Payer: Medicaid Other

## 2021-03-23 ENCOUNTER — Ambulatory Visit (HOSPITAL_BASED_OUTPATIENT_CLINIC_OR_DEPARTMENT_OTHER): Payer: Medicaid Other | Admitting: Obstetrics and Gynecology

## 2021-03-23 ENCOUNTER — Other Ambulatory Visit: Payer: Self-pay

## 2021-03-23 ENCOUNTER — Ambulatory Visit: Payer: Medicaid Other | Admitting: *Deleted

## 2021-03-23 ENCOUNTER — Ambulatory Visit (HOSPITAL_BASED_OUTPATIENT_CLINIC_OR_DEPARTMENT_OTHER): Payer: Medicaid Other | Admitting: *Deleted

## 2021-03-23 ENCOUNTER — Ambulatory Visit: Payer: Medicaid Other | Attending: Obstetrics and Gynecology

## 2021-03-23 ENCOUNTER — Encounter: Payer: Self-pay | Admitting: *Deleted

## 2021-03-23 ENCOUNTER — Other Ambulatory Visit: Payer: Self-pay | Admitting: Obstetrics and Gynecology

## 2021-03-23 VITALS — BP 127/76 | HR 89

## 2021-03-23 DIAGNOSIS — D573 Sickle-cell trait: Secondary | ICD-10-CM

## 2021-03-23 DIAGNOSIS — A599 Trichomoniasis, unspecified: Secondary | ICD-10-CM

## 2021-03-23 DIAGNOSIS — O365930 Maternal care for other known or suspected poor fetal growth, third trimester, not applicable or unspecified: Secondary | ICD-10-CM

## 2021-03-23 DIAGNOSIS — O09899 Supervision of other high risk pregnancies, unspecified trimester: Secondary | ICD-10-CM

## 2021-03-23 DIAGNOSIS — O0993 Supervision of high risk pregnancy, unspecified, third trimester: Secondary | ICD-10-CM | POA: Diagnosis not present

## 2021-03-23 DIAGNOSIS — Z3481 Encounter for supervision of other normal pregnancy, first trimester: Secondary | ICD-10-CM | POA: Insufficient documentation

## 2021-03-23 DIAGNOSIS — Z2839 Other underimmunization status: Secondary | ICD-10-CM | POA: Insufficient documentation

## 2021-03-23 DIAGNOSIS — Z3A37 37 weeks gestation of pregnancy: Secondary | ICD-10-CM

## 2021-03-23 DIAGNOSIS — O24419 Gestational diabetes mellitus in pregnancy, unspecified control: Secondary | ICD-10-CM

## 2021-03-23 NOTE — Procedures (Signed)
Casey Malone [birth year] [redacted]w[redacted]d  Fetus A Non-Stress Test Interpretation for 03/23/21  Indication: IUGR  Fetal Heart Rate A Mode: External Baseline Rate (A): 140 bpm Variability: Moderate Accelerations: 15 x 15 Decelerations: None Multiple birth?: No  Uterine Activity Mode: Palpation, Toco Contraction Frequency (min): ui Resting Tone Palpated: Relaxed  Interpretation (Fetal Testing) Nonstress Test Interpretation: Reactive Overall Impression: Reassuring for gestational age Comments: Dr. Judeth Cornfield reviewed tracing.

## 2021-03-23 NOTE — Progress Notes (Signed)
Maternal-Fetal Medicine   Name: Casey Malone DOB: 28-Dec-1990 MRN: [id number] Referring Provider: Coral Ceo  Casey Malone, G2 P1 at 37w 3d gestation, return for fetal growth assessment and antenatal testing.  On ultrasound performed at 34 weeks' gestation, the estimated fetal weight was at the 6th percentile. Obstetric history is significant for a term vaginal delivery in 2012 of a female infant weighing 6 lbs+ at birth. Patient has a recent diagnosis of gestational diabetes.  On today's ultrasound, amniotic fluid is normal good fetal activity seen.  The estimated fetal weight is at the 1st percentile.  Head circumference measurement is at between -2 and -1 SD (normal).  Abdominal circumference measurement is at the 4th percentile.  Umbilical artery Doppler showed normal forward diastolic flow.  NST is reactive.  BPP 10/10.  Blood pressure today at our office is 127/76 mm Hg.  Fetal growth restriction I counseled the couple on the finding of fetal growth restriction.  Patient does not have hypertension or any chronic medical conditions.  She has gestational diabetes but has not initiated education or self-monitoring of blood glucose.  I explained the finding of severe fetal growth restriction that is difficult to differentiate from a constitutionally small fetus. Patient is 4\' 11"  tall.  Severe fetal growth restriction is associated with increased risk of perinatal mortality and morbidity.  Based on [facility] guidelines, we recommend delivery at 37 weeks' gestation.  I explained that ultrasound has limitations in accurately estimating fetal weights.  Patient would like to be delivered at 38 weeks' gestation.  I discussed the findings and recommendations with Dr. (OB second attending), will be arranging an appointment for the patient to come to [facility] tomorrow for GBS screening and scheduling induction of labor.  Recommendations -Delivery at 34 weeks' gestation. -Patient will be  going for a prenatal visit tomorrow for GBS screening and scheduling induction of labor.  Thank you for consultation.  If you have any questions or concerns, please contact me the [facility].  Consultation including face-to-face (more than 50%) counseling 30 minutes.

## 2021-03-24 ENCOUNTER — Other Ambulatory Visit (HOSPITAL_COMMUNITY)
Admission: RE | Admit: 2021-03-24 | Discharge: 2021-03-24 | Disposition: A | Discharge status: Home / Self Care | Payer: Medicaid Other | Source: Ambulatory Visit | Attending: Obstetrics and Gynecology | Admitting: Obstetrics and Gynecology

## 2021-03-24 ENCOUNTER — Encounter (HOSPITAL_COMMUNITY): Payer: Self-pay | Admitting: *Deleted

## 2021-03-24 ENCOUNTER — Ambulatory Visit (INDEPENDENT_AMBULATORY_CARE_PROVIDER_SITE_OTHER): Payer: Medicaid Other | Admitting: Obstetrics and Gynecology

## 2021-03-24 ENCOUNTER — Other Ambulatory Visit: Payer: Self-pay | Admitting: Advanced Practice Midwife

## 2021-03-24 ENCOUNTER — Telehealth (HOSPITAL_COMMUNITY): Payer: Self-pay | Admitting: *Deleted

## 2021-03-24 ENCOUNTER — Encounter (HOSPITAL_COMMUNITY): Payer: Self-pay

## 2021-03-24 VITALS — BP 104/74 | HR 94 | Wt 127.0 lb

## 2021-03-24 DIAGNOSIS — O09899 Supervision of other high risk pregnancies, unspecified trimester: Secondary | ICD-10-CM

## 2021-03-24 DIAGNOSIS — A599 Trichomoniasis, unspecified: Secondary | ICD-10-CM

## 2021-03-24 DIAGNOSIS — D573 Sickle-cell trait: Secondary | ICD-10-CM | POA: Diagnosis not present

## 2021-03-24 DIAGNOSIS — Z2839 Other underimmunization status: Secondary | ICD-10-CM

## 2021-03-24 DIAGNOSIS — Z3A37 37 weeks gestation of pregnancy: Secondary | ICD-10-CM | POA: Diagnosis not present

## 2021-03-24 DIAGNOSIS — Z3483 Encounter for supervision of other normal pregnancy, third trimester: Secondary | ICD-10-CM

## 2021-03-24 DIAGNOSIS — O24410 Gestational diabetes mellitus in pregnancy, diet controlled: Secondary | ICD-10-CM

## 2021-03-24 NOTE — Progress Notes (Signed)
   PRENATAL VISIT NOTE  Subjective:  Casey Malone is a 30 y.o. G2P1001 at [redacted]w[redacted]d being seen today for ongoing prenatal care.  She is currently monitored for the following issues for this high-risk pregnancy and has Encounter for supervision of normal pregnancy in multigravida in third trimester; Rubella non-immune status, antepartum; Trichomonas infection; Sickle cell trait (HCC); [redacted] weeks gestation of pregnancy; and Diet controlled gestational diabetes mellitus (GDM) in third trimester on their problem list.  Patient doing well with no acute concerns today. She reports no complaints.  Contractions: Irritability. Vag. Bleeding: None.  Movement: Present. Denies leaking of fluid.   The following portions of the patient's history were reviewed and updated as appropriate: allergies, current medications, past family history, past medical history, past social history, past surgical history and problem list. Problem list updated.  Objective:   Vitals:   03/24/21 0908  BP: 104/74  Pulse: 94  Weight: 127 lb (57.6 kg)    Fetal Status: Fetal Heart Rate (bpm): 140 Fundal Height: 37 cm Movement: Present  Presentation: Vertex  General:  Alert, oriented and cooperative. Patient is in no acute distress.  Skin: Skin is warm and dry. No rash noted.   Cardiovascular: Normal heart rate noted  Respiratory: Normal respiratory effort, no problems with respiration noted  Abdomen: Soft, gravid, appropriate for gestational age.  Pain/Pressure: Present     Pelvic: Cervical exam performed Dilation: Closed Effacement (%): 40 Station: Ballotable  Extremities: Normal range of motion.  Edema: None  Mental Status:  Normal mood and affect. Normal behavior. Normal judgment and thought content.   Assessment and Plan:  Pregnancy: G2P1001 at [redacted]w[redacted]d  1. Trichomonas infection TOC performed today - Cervicovaginal ancillary only( [place])  2. Rubella non-immune status, antepartum Treat after delivery  3. Sickle  cell trait (HCC)   4. [redacted] weeks gestation of pregnancy   5. Encounter for supervision of normal pregnancy in multigravida in third trimester Schedule IOL for 03/27/21 2/2 FGR - Strep Gp B NAA - Cervicovaginal ancillary only( [place])  6. Diet controlled gestational diabetes mellitus (GDM) in third trimester Pt did not bring blood sugars, IOL scheduled  Term labor symptoms and general obstetric precautions including but not limited to vaginal bleeding, contractions, leaking of fluid and fetal movement were reviewed in detail with the patient.  Please refer to After Visit Summary for other counseling recommendations.   No follow-ups on file.   Mariel Aloe, MD Faculty Attending Center for [facility]

## 2021-03-24 NOTE — Telephone Encounter (Signed)
Preadmission screen  

## 2021-03-24 NOTE — Progress Notes (Signed)
ROB [redacted]w[redacted]d   Pt seen at MFM yesterday for BPP w/NST .per notes discuss induction.  +Trich on 01/07/21  *Pt forgot to bring blood sugar log.   CC: None

## 2021-03-25 ENCOUNTER — Other Ambulatory Visit: Payer: Self-pay | Admitting: Obstetrics and Gynecology

## 2021-03-25 LAB — CERVICOVAGINAL ANCILLARY ONLY
Chlamydia: NEGATIVE
Comment: NEGATIVE
Comment: NEGATIVE
Comment: NORMAL
Neisseria Gonorrhea: NEGATIVE
Trichomonas: NEGATIVE

## 2021-03-26 LAB — SARS CORONAVIRUS 2 (TAT 6-24 HRS): SARS Coronavirus 2: NEGATIVE

## 2021-03-26 LAB — STREP GP B NAA: Strep Gp B NAA: NEGATIVE

## 2021-03-27 ENCOUNTER — Inpatient Hospital Stay (HOSPITAL_COMMUNITY): Payer: Medicaid Other | Admitting: Anesthesiology

## 2021-03-27 ENCOUNTER — Other Ambulatory Visit: Payer: Self-pay

## 2021-03-27 ENCOUNTER — Inpatient Hospital Stay (HOSPITAL_COMMUNITY)
Admission: AD | Admit: 2021-03-27 | Discharge: 2021-03-29 | DRG: 807 | Disposition: A | Discharge status: Home / Self Care | Payer: Medicaid Other | Attending: Family Medicine | Admitting: Family Medicine

## 2021-03-27 ENCOUNTER — Inpatient Hospital Stay (HOSPITAL_COMMUNITY): Payer: Medicaid Other

## 2021-03-27 ENCOUNTER — Encounter (HOSPITAL_COMMUNITY): Payer: Self-pay | Admitting: Obstetrics and Gynecology

## 2021-03-27 DIAGNOSIS — Z3A38 38 weeks gestation of pregnancy: Secondary | ICD-10-CM

## 2021-03-27 DIAGNOSIS — D573 Sickle-cell trait: Secondary | ICD-10-CM | POA: Diagnosis present

## 2021-03-27 DIAGNOSIS — O24420 Gestational diabetes mellitus in childbirth, diet controlled: Secondary | ICD-10-CM | POA: Diagnosis present

## 2021-03-27 DIAGNOSIS — Z30017 Encounter for initial prescription of implantable subdermal contraceptive: Secondary | ICD-10-CM

## 2021-03-27 DIAGNOSIS — O365930 Maternal care for other known or suspected poor fetal growth, third trimester, not applicable or unspecified: Secondary | ICD-10-CM | POA: Diagnosis present

## 2021-03-27 DIAGNOSIS — O9902 Anemia complicating childbirth: Secondary | ICD-10-CM | POA: Diagnosis present

## 2021-03-27 DIAGNOSIS — O365990 Maternal care for other known or suspected poor fetal growth, unspecified trimester, not applicable or unspecified: Principal | ICD-10-CM

## 2021-03-27 LAB — GLUCOSE, CAPILLARY
Glucose-Capillary: 85 mg/dL (ref 70–99)
Glucose-Capillary: 63 mg/dL — ABNORMAL LOW (ref 70–99)
Glucose-Capillary: 110 mg/dL — ABNORMAL HIGH (ref 70–99)
Glucose-Capillary: 54 mg/dL — ABNORMAL LOW (ref 70–99)
Glucose-Capillary: 83 mg/dL (ref 70–99)
Glucose-Capillary: 66 mg/dL — ABNORMAL LOW (ref 70–99)
Glucose-Capillary: 82 mg/dL (ref 70–99)
Glucose-Capillary: 96 mg/dL (ref 70–99)
Glucose-Capillary: 80 mg/dL (ref 70–99)
Glucose-Capillary: 98 mg/dL (ref 70–99)
Glucose-Capillary: 92 mg/dL (ref 70–99)

## 2021-03-27 LAB — CBC
HCT: 29.5 % — ABNORMAL LOW (ref 36.0–46.0)
Hemoglobin: 10.3 g/dL — ABNORMAL LOW (ref 12.0–15.0)
MCH: 27.9 pg (ref 26.0–34.0)
MCHC: 34.9 g/dL (ref 30.0–36.0)
MCV: 79.9 fL — ABNORMAL LOW (ref 80.0–100.0)
Platelets: 303 10*3/uL (ref 150–400)
RBC: 3.69 MIL/uL — ABNORMAL LOW (ref 3.87–5.11)
RDW: 15.1 % (ref 11.5–15.5)
WBC: 7.9 10*3/uL (ref 4.0–10.5)
nRBC: 0 % (ref 0.0–0.2)

## 2021-03-27 LAB — RPR: RPR Ser Ql: NONREACTIVE

## 2021-03-27 LAB — TYPE AND SCREEN
ABO/RH(D): O POS
Antibody Screen: NEGATIVE

## 2021-03-27 MED ORDER — TERBUTALINE SULFATE 1 MG/ML IJ SOLN: 0.2500 mg | Freq: Once | INTRAMUSCULAR | Status: DC | PRN

## 2021-03-27 MED ORDER — ONDANSETRON HCL 4 MG/2ML IJ SOLN: 4.0000 mg | Freq: Four times a day (QID) | INTRAMUSCULAR | Status: DC | PRN

## 2021-03-27 MED ORDER — DEXTROSE IN LACTATED RINGERS 5 % IV SOLN
INTRAVENOUS | Status: DC
  Administered 2021-03-27: 950 mL via INTRAVENOUS

## 2021-03-27 MED ORDER — OXYTOCIN BOLUS FROM INFUSION
333.0000 mL | Freq: Once | INTRAVENOUS | Status: AC
  Administered 2021-03-27: 333 mL via INTRAVENOUS

## 2021-03-27 MED ORDER — OXYTOCIN-SODIUM CHLORIDE 30-0.9 UT/500ML-% IV SOLN: 1.0000 m[IU]/min | INTRAVENOUS | Status: DC

## 2021-03-27 MED ORDER — LIDOCAINE HCL (PF) 1 % IJ SOLN: 30.0000 mL | INTRAMUSCULAR | Status: DC | PRN

## 2021-03-27 MED ORDER — LACTATED RINGERS IV SOLN
500.0000 mL | Freq: Once | INTRAVENOUS | Status: AC
  Administered 2021-03-27: 500 mL via INTRAVENOUS

## 2021-03-27 MED ORDER — EPHEDRINE 5 MG/ML INJ: 10.0000 mg | INTRAVENOUS | Status: DC | PRN

## 2021-03-27 MED ORDER — PHENYLEPHRINE 40 MCG/ML (10ML) SYRINGE FOR IV PUSH (FOR BLOOD PRESSURE SUPPORT)
80.0000 ug | PREFILLED_SYRINGE | INTRAVENOUS | Status: DC | PRN
  Filled 2021-03-27: qty 10

## 2021-03-27 MED ORDER — MISOPROSTOL 50MCG HALF TABLET
50.0000 ug | ORAL_TABLET | ORAL | Status: DC | PRN
  Administered 2021-03-27: 50 ug via ORAL
  Filled 2021-03-27: qty 1

## 2021-03-27 MED ORDER — OXYTOCIN-SODIUM CHLORIDE 30-0.9 UT/500ML-% IV SOLN: 2.5000 [IU]/h | INTRAVENOUS | Status: DC

## 2021-03-27 MED ORDER — LIDOCAINE HCL (PF) 1 % IJ SOLN
INTRAMUSCULAR | Status: DC | PRN
  Administered 2021-03-27: 11 mL via EPIDURAL

## 2021-03-27 MED ORDER — OXYCODONE-ACETAMINOPHEN 5-325 MG PO TABS: 1.0000 | ORAL_TABLET | ORAL | Status: DC | PRN

## 2021-03-27 MED ORDER — SOD CITRATE-CITRIC ACID 500-334 MG/5ML PO SOLN: 30.0000 mL | ORAL | Status: DC | PRN

## 2021-03-27 MED ORDER — PHENYLEPHRINE 40 MCG/ML (10ML) SYRINGE FOR IV PUSH (FOR BLOOD PRESSURE SUPPORT)
80.0000 ug | PREFILLED_SYRINGE | INTRAVENOUS | Status: AC | PRN
Start: 2021-03-27 — End: 2021-03-27
  Administered 2021-03-27 (×3): 80 ug via INTRAVENOUS

## 2021-03-27 MED ORDER — OXYTOCIN-SODIUM CHLORIDE 30-0.9 UT/500ML-% IV SOLN
1.0000 m[IU]/min | INTRAVENOUS | Status: DC
  Administered 2021-03-27: 1 m[IU]/min via INTRAVENOUS
  Filled 2021-03-27: qty 500

## 2021-03-27 MED ORDER — FENTANYL-BUPIVACAINE-NACL 0.5-0.125-0.9 MG/250ML-% EP SOLN
12.0000 mL/h | EPIDURAL | Status: DC | PRN
  Administered 2021-03-27: 12 mL/h via EPIDURAL
  Filled 2021-03-27: qty 250

## 2021-03-27 MED ORDER — ACETAMINOPHEN 325 MG PO TABS: 650.0000 mg | ORAL_TABLET | ORAL | Status: DC | PRN

## 2021-03-27 MED ORDER — DIPHENHYDRAMINE HCL 50 MG/ML IJ SOLN
12.5000 mg | INTRAMUSCULAR | Status: DC | PRN
  Administered 2021-03-27: 12.5 mg via INTRAVENOUS
  Filled 2021-03-27: qty 1

## 2021-03-27 MED ORDER — FENTANYL CITRATE (PF) 100 MCG/2ML IJ SOLN: 50.0000 ug | INTRAMUSCULAR | Status: DC | PRN

## 2021-03-27 MED ORDER — LACTATED RINGERS IV SOLN
INTRAVENOUS | Status: DC
  Administered 2021-03-27 (×2): 950 mL via INTRAVENOUS

## 2021-03-27 MED ORDER — LACTATED RINGERS IV SOLN
500.0000 mL | INTRAVENOUS | Status: DC | PRN
Start: 2021-03-27 — End: 2021-03-28
  Administered 2021-03-27 (×2): 500 mL via INTRAVENOUS

## 2021-03-27 MED ORDER — MISOPROSTOL 25 MCG QUARTER TABLET: 25.0000 ug | ORAL_TABLET | ORAL | Status: DC | PRN

## 2021-03-27 NOTE — Progress Notes (Signed)
Labor Progress Note Casey Malone is a 30 y.o. G2P1001 at [redacted]w[redacted]d presented for IOL for FGR  S:  Comfortable with epidural.  O:  BP 100/73   Pulse 92   Temp (!) 97.4 F (36.3 C) (Oral)   Resp 20   Ht 4\' 11"  (1.499 m)   Wt 57.4 kg   LMP 07/04/2020   SpO2 100%   BMI 25.57 kg/m  EFM: baseline 145 bpm/ mod variability/ no accels/ no decels  Toco/IUPC: 2-4 SVE: Dilation: 5 Effacement (%): 50 Station: -2 Presentation: Vertex Exam by:: [email or web address], RN Pitocin: 4 mu/min  A/P: 30 y.o. G2P1001 [redacted]w[redacted]d  1. Labor: early active 2. FWB: Cat I 3. Pain: epidural 4. GDM: several episodes of symptomatic hypogylcemia  AROM for clear fluid. Start D5LR with maintenance fluids. Anticipate labor progress and SVD.  [redacted]w[redacted]d, CNM 6:03 PM

## 2021-03-27 NOTE — Anesthesia Procedure Notes (Signed)
Epidural Patient location during procedure: OB Start time: 03/27/2021 2:32 PM End time: 03/27/2021 2:46 PM  Staffing Anesthesiologist: Lowella Curb, MD Performed: anesthesiologist   Preanesthetic Checklist Completed: patient identified, IV checked, site marked, risks and benefits discussed, surgical consent, monitors and equipment checked, pre-op evaluation and timeout performed  Epidural Patient position: sitting Prep: ChloraPrep Patient monitoring: heart rate, cardiac monitor, continuous pulse ox and blood pressure Approach: midline Location: L2-L3 Injection technique: LOR saline  Needle:  Needle type: Tuohy  Needle gauge: 17 G Needle length: 9 cm Needle insertion depth: 4 cm Catheter type: closed end flexible Catheter size: 20 Guage Catheter at skin depth: 8 cm Test dose: negative  Assessment Events: blood not aspirated, injection not painful, no injection resistance, no paresthesia and negative IV test  Additional Notes Reason for block:procedure for pain

## 2021-03-27 NOTE — Discharge Summary (Addendum)
Postpartum Discharge Summary     Patient Name: Casey Malone DOB: 04/24/1991 MRN: [id number]  Date of admission: 03/27/2021 Delivery date:03/27/2021  Delivering provider: Starr Lake  Date of discharge: 03/29/2021  Admitting diagnosis: Pregnancy affected by fetal growth restriction [O36.5990] Intrauterine pregnancy: [redacted]w[redacted]d    Secondary diagnosis:  Active Problems:   IUGR (intrauterine growth restriction) affecting care of mother   Pregnancy affected by fetal growth restriction  Additional problems: GDMA1     Discharge diagnosis: Term Pregnancy Delivered                                              Post partum procedures: nexplanon placement Augmentation: AROM, Pitocin, Cytotec, and IP Foley Complications: None  Hospital course: Induction of Labor With Vaginal Delivery   30y.o. yo G2P1001 at 315w0das admitted to the hospital 03/27/2021 for induction of labor.  Indication for induction:  GDM without treatment and IUGR .  Patient had an uncomplicated labor course as follows: Membrane Rupture Time/Date: 6:01 PM ,03/27/2021   Delivery Method:Vaginal, Spontaneous  Episiotomy: None  Lacerations:  2nd degree  Details of delivery can be found in separate delivery note.  Patient had a routine postpartum course. Patient is discharged home 03/29/21.  Newborn Data: Birth date:03/27/2021  Birth time:9:03 PM  Gender:Female  Living status:Living  Apgars:9 ,9  [phone]   Magnesium Sulfate received: No BMZ received: No Rhophylac:No MMR: rubella non-immune, MMR ordered T-DaP: declined Flu: No Transfusion:No  Physical exam  Vitals:   03/28/21 0950 03/28/21 1350 03/28/21 2231 03/29/21 0635  BP: 114/78 116/78 98/61 (!) 96/58  Pulse: 96 92 94 83  Resp: 18 16 16 18   Temp: 98 F (36.7 C) 98.1 F (36.7 C) 98.4 F (36.9 C) 97.8 F (36.6 C)  TempSrc: Oral Axillary Oral Oral  SpO2: 99% 100% 99%   Weight:      Height:       General: alert, cooperative, and no  distress Lochia: appropriate Uterine Fundus: firm Incision: N/A DVT Evaluation: No evidence of DVT seen on physical exam. No significant calf/ankle edema. Labs: Lab Results  Component Value Date   WBC 7.9 03/27/2021   HGB 10.3 (L) 03/27/2021   HCT 29.5 (L) 03/27/2021   MCV 79.9 (L) 03/27/2021   PLT 303 03/27/2021   CMP Latest Ref Rng & Units 03/29/2013  Glucose 70 - 99 mg/dL 128(H)  BUN 6 - 23 mg/dL 12  Creatinine 0.50 - 1.10 mg/dL 0.80  Sodium 135 - 145 mEq/L 138  Potassium 3.5 - 5.1 mEq/L 4.2  Chloride 96 - 112 mEq/L 104  CO2 19 - 32 mEq/L 25  Calcium 8.4 - 10.5 mg/dL 9.5   Edinburgh Score: No flowsheet data found.   After visit meds:  Allergies as of 03/29/2021   No Known Allergies      Medication List     STOP taking these medications    Accu-Chek Guide test strip Generic drug: glucose blood   Accu-Chek Guide w/Device Kit   Accu-Chek Softclix Lancets lancets       TAKE these medications    acetaminophen 325 MG tablet Commonly known as: Tylenol Take 2 tablets (650 mg total) by mouth every 4 (four) hours as needed for mild pain or moderate pain.   cyclobenzaprine 10 MG tablet Commonly known as: FLEXERIL Take 1 tablet (10 mg total)  by mouth every 8 (eight) hours as needed for muscle spasms.   ibuprofen 200 MG tablet Commonly known as: ADVIL Take 3 tablets (600 mg total) by mouth every 6 (six) hours as needed for mild pain or moderate pain.   Iron Polysacch Cmplx-B12-FA 150-0.025-1 MG Caps Take 1 capsule by mouth every other day.   Prenatal Vitamin 27-0.8 MG Tabs Take by mouth.   senna-docusate 8.6-50 MG tablet Commonly known as: Senokot-S Take 2 tablets by mouth daily as needed for mild constipation.         Discharge home in stable condition Infant Feeding: Bottle and Breast Infant Disposition:home with mother Discharge instruction: per After Visit Summary and Postpartum booklet. Activity: Advance as tolerated. Pelvic rest for 6  weeks.  Diet: routine diet Future Appointments:No future appointments. Follow up Visit:   Please schedule this patient for a In person postpartum visit in 4 weeks with the following provider: APP. Additional Postpartum F/U:2 hour GTT  High risk pregnancy complicated by:  GDMA1 not managed and  Delivery mode:  Vaginal, Spontaneous  Anticipated Birth Control:  Nexplanon in patient (planning)   03/29/2021 Mayer Masker, PGY-1, Faculty Service  Attestation of Supervision of Student:  I confirm that I have verified the information documented in the  resident  student's note and that I have also personally reperformed the history, physical exam and all medical decision making activities.  I have verified that all services and findings are accurately documented in this student's note; and I agree with management and plan as outlined in the documentation. I have also made any necessary editorial changes.   Wende Mott, [place] for [employer], [employer] 03/29/2021 9:10 AM

## 2021-03-27 NOTE — Progress Notes (Signed)
Hypoglycemic Event  CBG: 63  Treatment: 4 oz juice/soda  Symptoms: None  Follow-up CBG: Time:12:55 CBG Result:110  Possible Reasons for Event: Inadequate meal intake  Comments/MD notified: Donette Larry, CNM    Roseanne Kaufman, RN

## 2021-03-27 NOTE — H&P (Addendum)
OBSTETRIC ADMISSION HISTORY AND PHYSICAL  Casey Malone is a 30 y.o. female G2P1001 with IUP at 63w0dby LMP presenting for IOL for IUGR. She reports +FMs, No contractions, no LOF, no VB, no blurry vision, headaches or peripheral edema, and RUQ pain. She plans on breast and bottle feeding. She request Nexplanon for birth control. [facility]  Dating: By LMP --->  Estimated Date of Delivery: 04/10/21  Sono:  03/23/21  _0 , CWD, normal anatomy, cephalic presentation, 27371G 1.4% EFW   Prenatal History/Complications: IUGR, GDMA1, sickle cell trait  Past Medical History: Past Medical History:  Diagnosis Date   Anemia     Past Surgical History: Past Surgical History:  Procedure Laterality Date   NO PAST SURGERIES      Obstetrical History: OB History     Gravida  2   Para  1   Term  1   Preterm      AB  0   Living  1      SAB      IAB      Ectopic      Multiple      Live Births  1           Social History Social History   Socioeconomic History   Marital status: Single    Spouse name: Not on file   Number of children: Not on file   Years of education: Not on file   Highest education level: Not on file  Occupational History   Not on file  Tobacco Use   Smoking status: Never   Smokeless tobacco: Never  Vaping Use   Vaping Use: Never used  Substance and Sexual Activity   Alcohol use: No   Drug use: Not Currently    Types: Marijuana    Comment: Stopped when confirmed pregnancy   Sexual activity: Yes    Partners: Male    Birth control/protection: None    Comment: wants depo or implant  Other Topics Concern   Not on file  Social History Narrative   Not on file   Social Determinants of Health   Financial Resource Strain: Not on file  Food Insecurity: Not on file  Transportation Needs: Not on file  Physical Activity: Not on file  Stress: Not on file  Social Connections: Not on file    Family  History: Family History  Problem Relation Age of Onset   Diabetes Maternal Grandmother     Allergies: No Known Allergies  Medications Prior to Admission  Medication Sig Dispense Refill Last Dose   cyclobenzaprine (FLEXERIL) 10 MG tablet Take 1 tablet (10 mg total) by mouth every 8 (eight) hours as needed for muscle spasms. 30 tablet 1 Past Month   Iron Polysacch Cmplx-B12-FA 150-0.025-1 MG CAPS Take 1 capsule by mouth every other day. 30 capsule 6 03/27/2021   Prenatal Vit-Fe Fumarate-FA (PRENATAL VITAMIN) 27-0.8 MG TABS Take by mouth.   03/26/2021   Accu-Chek Softclix Lancets lancets Use 1 lancet to check blood sugar 4 times daily 100 each 5    Blood Glucose Monitoring Suppl (ACCU-CHEK GUIDE) w/Device KIT Use glucose meter to monitor blood sugar 4 times daily 1 kit 0    glucose blood (ACCU-CHEK GUIDE) test strip Use 1 test strip to check blood sugar 4 times daily 100 each 5      Review of Systems   All systems reviewed and negative except as stated in HPI  Blood pressure 108/79, pulse (Marland Kitchen  106, temperature 98.2 F (36.8 C), temperature source Oral, resp. rate 17, last menstrual period 07/04/2020, unknown if currently breastfeeding. General appearance: alert, cooperative, and no distress Lungs: clear to auscultation bilaterally Heart: regular rate and rhythm Abdomen: soft, non-tender; bowel sounds normal Pelvic: See below Extremities: no edema, no signs of DVT Presentation: cephalic Fetal monitoringBaseline: 150 bpm, Variability: Good {> 6 bpm), Accelerations: Reactive, and Decelerations: Absent Uterine activity: irritability. No regular contractions Dilation: Closed Effacement (%): Thick Station: -3 Exam by:: Dr. Marburg   Prenatal labs: ABO, Rh: --/--/PENDING (10/07 0703) Antibody: PENDING (10/07 0703) Rubella: <0.90 (03/30 1515) RPR: Non Reactive (09/01 1155)  HBsAg: Negative (03/30 1515)  HIV: Non Reactive (09/01 1155)  GBS: Negative/-- (10/04 0941)  2 hr Glucola  failed Genetic screening: NIPS normal. Sickle cell carrier Anatomy US normal  Prenatal Transfer Tool  Maternal Diabetes: GDM diet controlled Genetic Screening: NIPS low risk. Sickle cell carrier Maternal Ultrasounds/Referrals: IUGR Fetal Ultrasounds or other Referrals: Fetal cord doppler normal Maternal Substance Abuse:  No Significant Maternal Medications:  None Significant Maternal Lab Results: None  Results for orders placed or performed during the hospital encounter of 03/27/21 (from the past 24 hour(s))  CBC   Collection Time: 03/27/21  7:03 AM  Result Value Ref Range   WBC 7.9 4.0 - 10.5 K/uL   RBC 3.69 (L) 3.87 - 5.11 MIL/uL   Hemoglobin 10.3 (L) 12.0 - 15.0 g/dL   HCT 29.5 (L) 36.0 - 46.0 %   MCV 79.9 (L) 80.0 - 100.0 fL   MCH 27.9 26.0 - 34.0 pg   MCHC 34.9 30.0 - 36.0 g/dL   RDW 15.1 11.5 - 15.5 %   Platelets 303 150 - 400 K/uL   nRBC 0.0 0.0 - 0.2 %  Type and screen   Collection Time: 03/27/21  7:03 AM  Result Value Ref Range   ABO/RH(D) PENDING    Antibody Screen PENDING    Sample Expiration      03/30/2021,2359 Performed at [facility], [address] [place], [place] [address]     Patient Active Problem List   Diagnosis Date Noted   IUGR (intrauterine growth restriction) affecting care of mother 03/27/2021   Pregnancy affected by fetal growth restriction 03/27/2021   [redacted] weeks gestation of pregnancy 03/24/2021   Diet controlled gestational diabetes mellitus (GDM) in third trimester 03/24/2021   Sickle cell trait (HCC) 09/27/2020   Rubella non-immune status, antepartum 09/23/2020   Trichomonas infection 09/23/2020   Encounter for supervision of normal pregnancy in multigravida in third trimester 09/10/2020    Assessment/Plan:  Casey Malone is a 29 y.o. G2P1001 at [redacted]w[redacted]d here for IOL for IUGR  #Labor:Not in labor. Begin induction with 50mcg oral cytotec #GDMA1: q4 CBG #Rubella non-immune: postpartum MMR #Pain: desires  epidural #FWB: cat1 #ID:  GBS neg #MOF: breast and bottle #MOC: Nexplanon #Circ: N/A  Andrew K Marburg, PGY-1, Faculty Service 03/27/2021, 7:50 AM   Attestation:  I confirm that I have verified the information documented in the resident's note and that I have also personally reperformed the physical exam and all medical decision making activities.  The patient was seen and examined by me also Agree with note NST reactive and reassuring UCs as listed Cervical exams as listed in note Williams, Marie L, CNM  

## 2021-03-27 NOTE — Progress Notes (Signed)
Hypoglycemic Event  CBG: 54  Treatment: 4 oz juice/soda  Symptoms: Pale, Sweaty, and Nervous/irritable  Follow-up CBG: Time:1522 CBG Result:83  Possible Reasons for Event: Unknown  Comments/MD notified:Melanie [place], CNM    Roseanne Kaufman, RN

## 2021-03-27 NOTE — Progress Notes (Signed)
Labor Progress Note Casey Malone is a 30 y.o. G2P1001 at [redacted]w[redacted]d presented for IOL for FGR  S:  Comfortable no c/o.  O:  BP 105/64   Pulse 94   Temp 98.1 F (36.7 C)   Resp 17   Ht 4\' 11"  (1.499 m)   Wt 57.4 kg   LMP 07/04/2020   BMI 25.57 kg/m  EFM: baseline 150 bpm/ mod variability/ no accels/ occ variable decels  Toco/IUPC: irreg SVE: 1/60/-2   A/P: 30 y.o. G2P1001 [redacted]w[redacted]d  1. Labor: latent 2. FWB: Cat II 3. Pain: analgesia/anesthesia/NO prn 4. GDM: stable  Consented for FB placement, placed and tolerated well. Will start low dose Pitocin instead of Cytotec in event baby does not tolerate ctx. Anticipate labor progress and SVD.  [redacted]w[redacted]d, CNM 12:56 PM

## 2021-03-27 NOTE — Anesthesia Preprocedure Evaluation (Signed)
Anesthesia Evaluation  Patient identified by MRN, date of birth, ID band Patient awake    Reviewed: Allergy & Precautions, NPO status , Patient's Chart, lab work & pertinent test results  Airway Mallampati: II  TM Distance: >3 FB Neck ROM: Full    Dental no notable dental hx.    Pulmonary neg pulmonary ROS,    Pulmonary exam normal breath sounds clear to auscultation       Cardiovascular negative cardio ROS Normal cardiovascular exam Rhythm:Regular Rate:Normal     Neuro/Psych negative neurological ROS  negative psych ROS   GI/Hepatic negative GI ROS, Neg liver ROS,   Endo/Other  diabetes  Renal/GU negative Renal ROS  negative genitourinary   Musculoskeletal negative musculoskeletal ROS (+)   Abdominal   Peds negative pediatric ROS (+)  Hematology negative hematology ROS (+)   Anesthesia Other Findings   Reproductive/Obstetrics (+) Pregnancy                             Anesthesia Physical Anesthesia Plan  ASA: 3  Anesthesia Plan: Epidural   Post-op Pain Management:    Induction:   PONV Risk Score and Plan:   Airway Management Planned:   Additional Equipment:   Intra-op Plan:   Post-operative Plan:   Informed Consent:   Plan Discussed with:   Anesthesia Plan Comments:         Anesthesia Quick Evaluation

## 2021-03-28 ENCOUNTER — Encounter (HOSPITAL_COMMUNITY): Payer: Self-pay | Admitting: Obstetrics and Gynecology

## 2021-03-28 DIAGNOSIS — Z30017 Encounter for initial prescription of implantable subdermal contraceptive: Secondary | ICD-10-CM

## 2021-03-28 LAB — GLUCOSE, CAPILLARY: Glucose-Capillary: 96 mg/dL (ref 70–99)

## 2021-03-28 MED ORDER — BENZOCAINE-MENTHOL 20-0.5 % EX AERO
1.0000 "application " | INHALATION_SPRAY | CUTANEOUS | Status: DC | PRN
  Filled 2021-03-28: qty 56

## 2021-03-28 MED ORDER — ONDANSETRON HCL 4 MG/2ML IJ SOLN: 4.0000 mg | INTRAMUSCULAR | Status: DC | PRN

## 2021-03-28 MED ORDER — SENNOSIDES-DOCUSATE SODIUM 8.6-50 MG PO TABS
2.0000 | ORAL_TABLET | Freq: Every day | ORAL | Status: DC
  Administered 2021-03-28: 2 via ORAL
  Filled 2021-03-28 (×2): qty 2

## 2021-03-28 MED ORDER — ONDANSETRON HCL 4 MG PO TABS: 4.0000 mg | ORAL_TABLET | ORAL | Status: DC | PRN

## 2021-03-28 MED ORDER — IBUPROFEN 600 MG PO TABS
600.0000 mg | ORAL_TABLET | Freq: Four times a day (QID) | ORAL | Status: DC
  Administered 2021-03-28 – 2021-03-29 (×7): 600 mg via ORAL
  Filled 2021-03-28 (×7): qty 1

## 2021-03-28 MED ORDER — ACETAMINOPHEN 325 MG PO TABS
650.0000 mg | ORAL_TABLET | ORAL | Status: DC | PRN
  Administered 2021-03-28: 650 mg via ORAL
  Filled 2021-03-28: qty 2

## 2021-03-28 MED ORDER — LIDOCAINE HCL 1 % IJ SOLN
0.0000 mL | Freq: Once | INTRAMUSCULAR | Status: AC | PRN
  Administered 2021-03-28: 3 mL via INTRADERMAL
  Filled 2021-03-28: qty 20

## 2021-03-28 MED ORDER — DIPHENHYDRAMINE HCL 25 MG PO CAPS: 25.0000 mg | ORAL_CAPSULE | Freq: Four times a day (QID) | ORAL | Status: DC | PRN

## 2021-03-28 MED ORDER — ETONOGESTREL 68 MG ~~LOC~~ IMPL
68.0000 mg | DRUG_IMPLANT | Freq: Once | SUBCUTANEOUS | Status: AC
  Administered 2021-03-28: 68 mg via SUBCUTANEOUS
  Filled 2021-03-28: qty 1

## 2021-03-28 MED ORDER — PRENATAL MULTIVITAMIN CH
1.0000 | ORAL_TABLET | Freq: Every day | ORAL | Status: DC
  Administered 2021-03-28 – 2021-03-29 (×2): 1 via ORAL
  Filled 2021-03-28 (×2): qty 1

## 2021-03-28 MED ORDER — COCONUT OIL OIL
1.0000 "application " | TOPICAL_OIL | Status: DC | PRN
  Administered 2021-03-28: 1 via TOPICAL

## 2021-03-28 MED ORDER — SIMETHICONE 80 MG PO CHEW: 80.0000 mg | CHEWABLE_TABLET | ORAL | Status: DC | PRN

## 2021-03-28 MED ORDER — WITCH HAZEL-GLYCERIN EX PADS: 1.0000 "application " | MEDICATED_PAD | CUTANEOUS | Status: DC | PRN

## 2021-03-28 MED ORDER — TETANUS-DIPHTH-ACELL PERTUSSIS 5-2.5-18.5 LF-MCG/0.5 IM SUSY
0.5000 mL | PREFILLED_SYRINGE | Freq: Once | INTRAMUSCULAR | Status: DC
Start: 2021-03-28 — End: 2021-03-29

## 2021-03-28 MED ORDER — DIBUCAINE (PERIANAL) 1 % EX OINT: 1.0000 "application " | TOPICAL_OINTMENT | CUTANEOUS | Status: DC | PRN

## 2021-03-28 NOTE — Lactation Note (Signed)
This note was copied from a baby's chart. Lactation Consultation Note  Patient Name: Girl Alexes Menchaca [id number] Date: 03/28/2021 Reason for consult: Initial assessment;1st time breastfeeding;Infant < 6lbs Age:28 hours  P2, 1st time breastfeeding.  Reviewed hand expression with drops.  Mother chooses to supplement with formula. Discussed offering breast first and then formula. Reviewed volume guidelines. Feed on demand with cues.  Goal 8-12+ times per day after first 24 hrs.  Place baby STS if not cueing.  Reviewed engorgement care and monitoring voids/stools.   Maternal Data Has patient been taught Hand Expression?: Yes Does the patient have breastfeeding experience prior to this delivery?: No  Feeding Mother's Current Feeding Choice: Breast Milk and Formula   Interventions Interventions: Breast feeding basics reviewed;Hand express;Education   Consult Status Consult Status: Follow-up Date: 03/28/21 Follow-up type: In-patient    Dahlia Byes [facility] 03/28/2021, 7:54 AM

## 2021-03-28 NOTE — Progress Notes (Signed)
Call received form Melanie CNM to get accu check on patient now, results 96

## 2021-03-28 NOTE — Progress Notes (Signed)
Post Partum Day 1 Subjective: no complaints, up ad lib, voiding, and tolerating PO  Objective: Blood pressure 115/76, pulse 89, temperature 98.6 F (37 C), temperature source Oral, resp. rate 17, height 4\' 11"  (1.499 m), weight 57.4 kg, last menstrual period 07/04/2020, SpO2 100 %, unknown if currently breastfeeding.  Physical Exam:  General: alert, cooperative, and no distress Lochia: appropriate Uterine Fundus: firm Incision: healing well DVT Evaluation: No evidence of DVT seen on physical exam. Negative Homan's sign. No cords or calf tenderness. No significant calf/ankle edema.  Recent Labs    03/27/21 0703  HGB 10.3*  HCT 29.5*   Assessment/Plan: Plan for discharge tomorrow, Breastfeeding, Lactation consult, and Contraception Nexplanon S/p SVD GDM, delivered; CBG pending  LOS: 1 day   05/27/21, CNM 03/28/2021, 10:32 AM

## 2021-03-28 NOTE — Lactation Note (Signed)
This note was copied from a baby's chart. Lactation Consultation Note  Patient Name: Casey Malone [id number] Date: 03/28/2021 Reason for consult: Follow-up assessment;1st time breastfeeding;Early term 37-38.6wks;Infant < 6lbs Age:28 hours LC entered the room, infant was asleep in basinet, per mom, PGM had given infant 35 mls of formula at 1451 pm, infant was not interested in feeding at this time.   Per mom, infant is latching well at breast she only feels tug with latch. LC discussed hand expression, mom taught back and expressed 6 mls of colostrum ,that she will offer to infant after latching infant at the breast for the next feeding. Mom was given a hand pump due to not having pump at home for prn use and explained how to use. Mom shown how to use hand pump & how to disassemble, clean, & reassemble parts.  Mom's plan: 1- Mom will breastfeed infant according to feeding cues, 8 to 12+ times within 24 hours, skin to skin. 2- Mom will latch infant at breast for every feeding to help stimulate and establish milk supply and afterwards will supplement with her EBM first before offering formula. 3- Mom will continue to offer 15 mls of EBM/Formula after latching infant at breast on Day1, discussed pace feeding, due to mom giving infant high volumes of formula. 4-RN will assist mom with offering EBM by spoon that mom hand expressed and mom know to call RN/LC if she has amy questions, concerns or need assistance with latching infant at breast.     Maternal Data Has patient been taught Hand Expression?: Yes Does the patient have breastfeeding experience prior to this delivery?: No  Feeding Mother's Current Feeding Choice: Breast Milk and Formula Nipple Type: Slow - flow  LATCH Score                    Lactation Tools Discussed/Used    Interventions Interventions: Skin to skin;Breast compression;Expressed milk;Hand pump;Education;Pace feeding  Discharge Pump:  Manual  Consult Status Consult Status: Follow-up Date: 03/29/21 Follow-up type: In-patient    Danelle Earthly 03/28/2021, 4:37 PM

## 2021-03-28 NOTE — Procedures (Signed)
GYNECOLOGY PROCEDURE NOTE  Casey Malone is a 30 y.o. G2E3662 requesting Nexplanon insertion. No gynecologic concerns.  Nexplanon Insertion Procedure Patient identified, informed consent performed, consent signed. Patient does understand that irregular bleeding is a very common side effect of this medication. She was advised to have backup contraception for one week after placement. Appropriate time out taken. Patient's left arm was prepped and draped in the usual sterile fashion. The insertion area was measured and marked. Patient was prepped with alcohol swab and then injected with 3 ml of 1% lidocaine. The area was then prepped with betadine. Nexplanon removed from packaging and device confirmed present within needle, then inserted full length of needle and withdrawn per handbook instructions. Nexplanon was able to palpated in the patient's arm; patient palpated the insert herself. There was minimal blood loss. Patient insertion site covered with steri strip, guaze, and a pressure bandage to reduce any bruising. The patient tolerated the procedure well and was given post procedure instructions.

## 2021-03-28 NOTE — Anesthesia Postprocedure Evaluation (Signed)
Anesthesia Post Note  Patient: Casey Malone  Procedure(s) Performed: AN AD HOC LABOR EPIDURAL     Patient location during evaluation: Mother Baby Anesthesia Type: Epidural Level of consciousness: awake and alert Pain management: pain level controlled Vital Signs Assessment: post-procedure vital signs reviewed and stable Respiratory status: spontaneous breathing Cardiovascular status: stable Postop Assessment: no headache, adequate PO intake, no backache, patient able to bend at knees, able to ambulate and epidural receding Anesthetic complications: no   No notable events documented.  Last Vitals:  Vitals:   03/28/21 0054 03/28/21 0538  BP: 108/81 115/76  Pulse: (!) 109 89  Resp: 16 17  Temp: 37.1 C 37 C  SpO2: 99% 100%    Last Pain:  Vitals:   03/28/21 0538  TempSrc: Oral  PainSc:    Pain Goal:                   Salome Arnt

## 2021-03-29 LAB — GLUCOSE, CAPILLARY: Glucose-Capillary: 82 mg/dL (ref 70–99)

## 2021-03-29 MED ORDER — SENNOSIDES-DOCUSATE SODIUM 8.6-50 MG PO TABS: 2.0000 | ORAL_TABLET | Freq: Every day | ORAL | Status: AC | PRN

## 2021-03-29 MED ORDER — MEASLES, MUMPS & RUBELLA VAC IJ SOLR: 0.5000 mL | Freq: Once | INTRAMUSCULAR | Status: DC

## 2021-03-29 MED ORDER — IBUPROFEN 200 MG PO TABS: 600.0000 mg | ORAL_TABLET | Freq: Four times a day (QID) | ORAL | Status: AC | PRN

## 2021-03-29 MED ORDER — ACETAMINOPHEN 325 MG PO TABS: 650.0000 mg | ORAL_TABLET | ORAL | Status: AC | PRN

## 2021-03-30 ENCOUNTER — Other Ambulatory Visit: Payer: Self-pay | Admitting: *Deleted

## 2021-04-07 ENCOUNTER — Telehealth (HOSPITAL_COMMUNITY): Payer: Self-pay

## 2021-04-07 NOTE — Telephone Encounter (Signed)
Unable to contact. Phone line says your call cannot be completed at this time.   Marcelino [facility] 04/07/2021,1325

## 2021-05-11 ENCOUNTER — Ambulatory Visit: Payer: Medicaid Other | Admitting: Advanced Practice Midwife

## 2021-05-11 ENCOUNTER — Other Ambulatory Visit: Payer: Medicaid Other

## 2022-01-19 IMAGING — US US MFM OB DETAIL+14 WK
1 series · 13 of 28 positions shown · non-contrast
Comparison: none

[Series 1: us mfm ob detail+14 wk · 104 acquisitions, 13 frames shown]
[im 4/104]
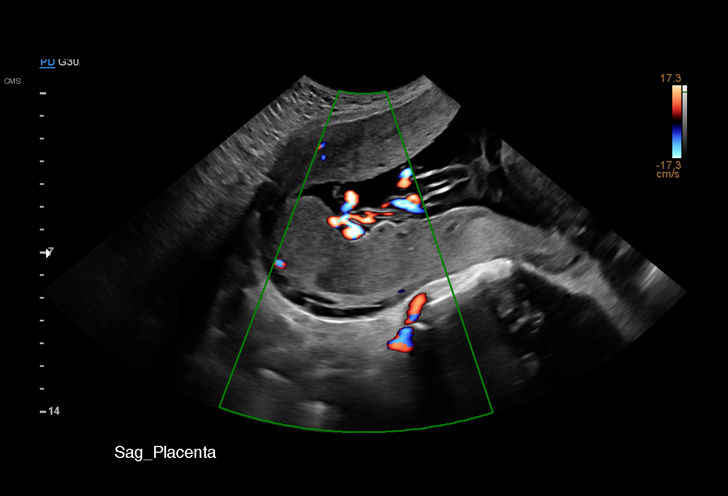
[im 12/104]
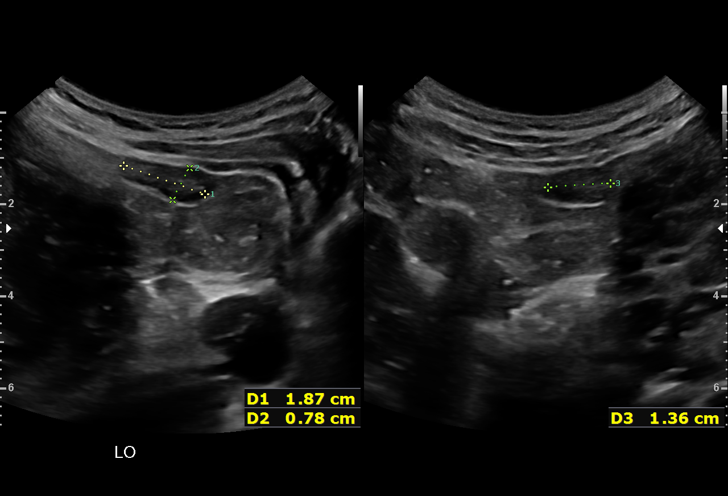
[im 20/104]
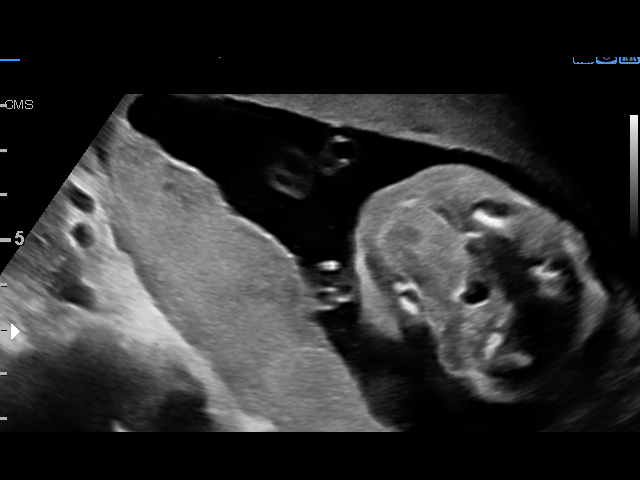
[im 27/104]
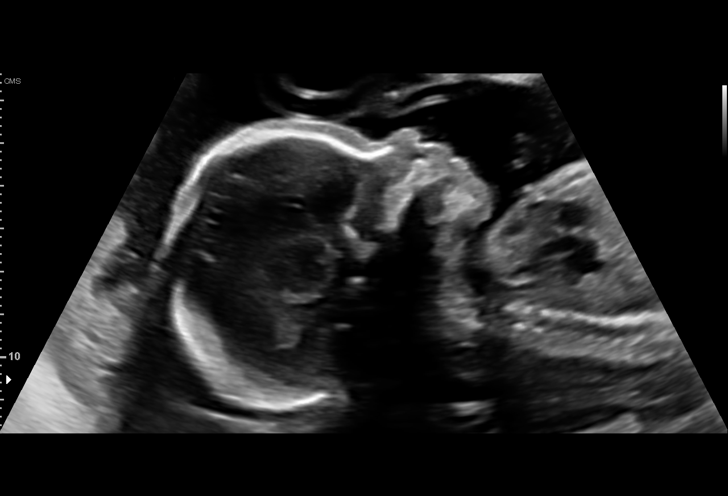
[im 35/104]
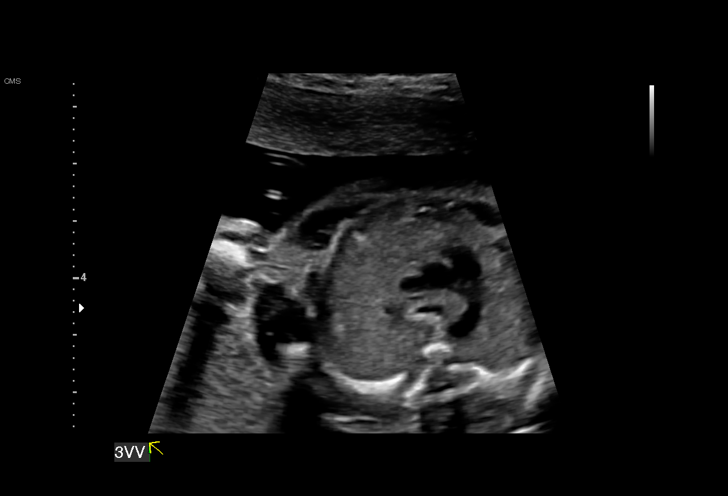
[im 42/104]
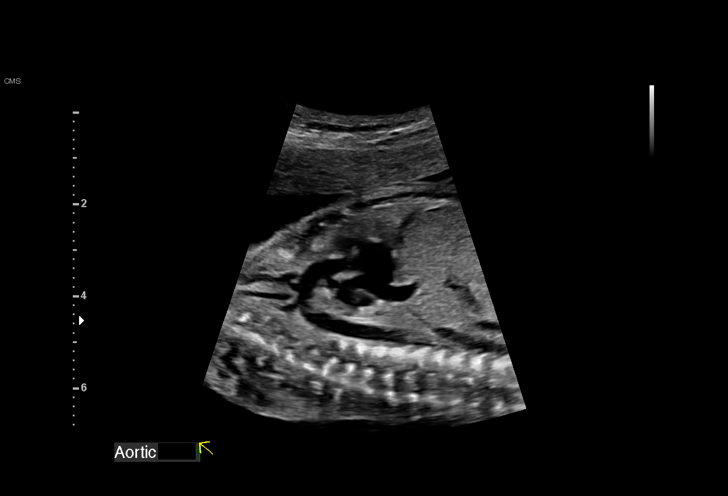
[im 54/104]
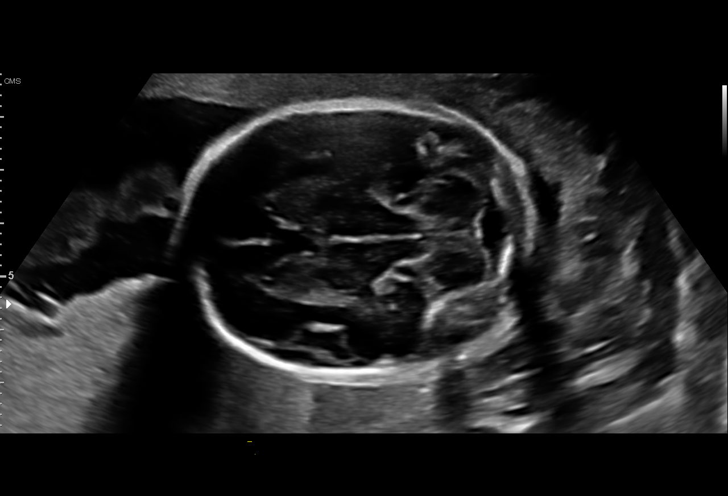
[im 62/104]
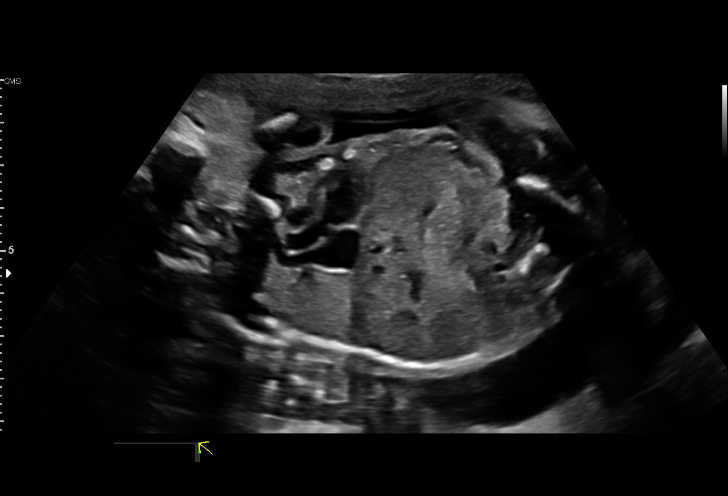
[im 69/104]
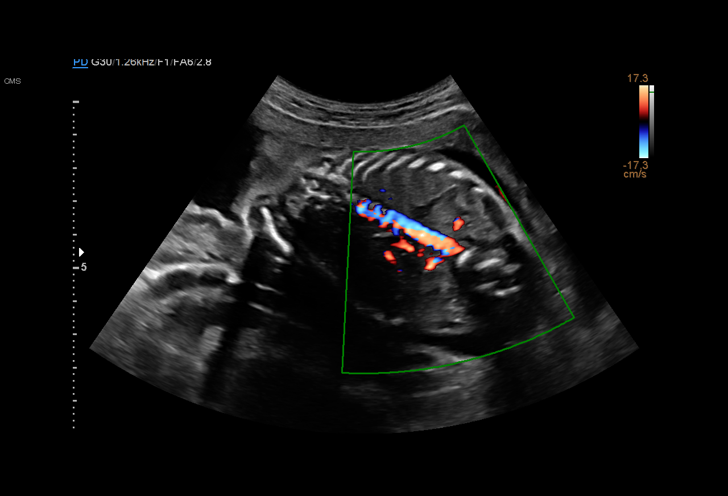
[im 77/104]
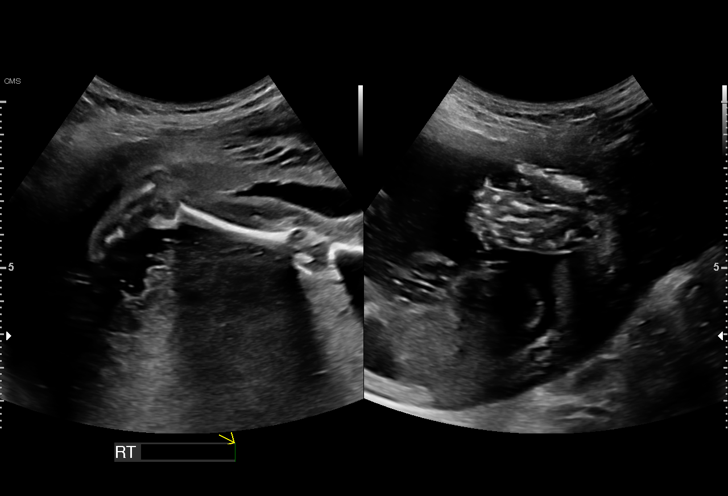
[im 84/104]
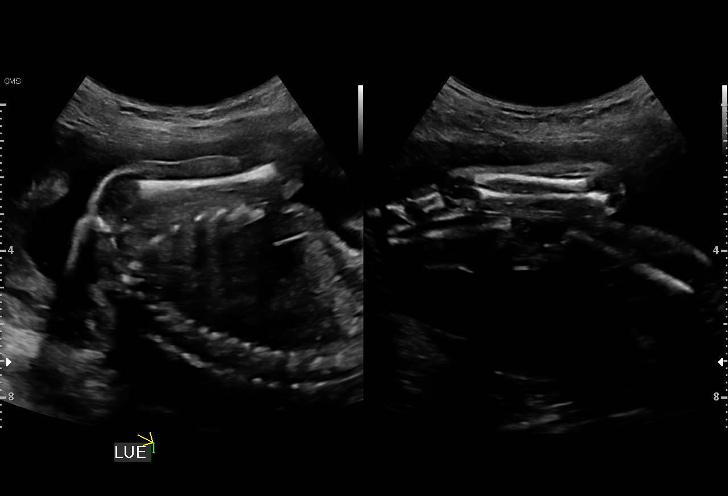
[im 92/104]
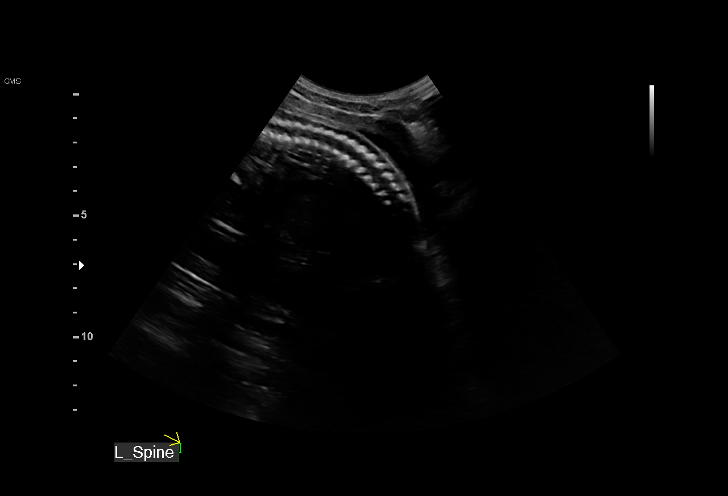
[im 100/104]
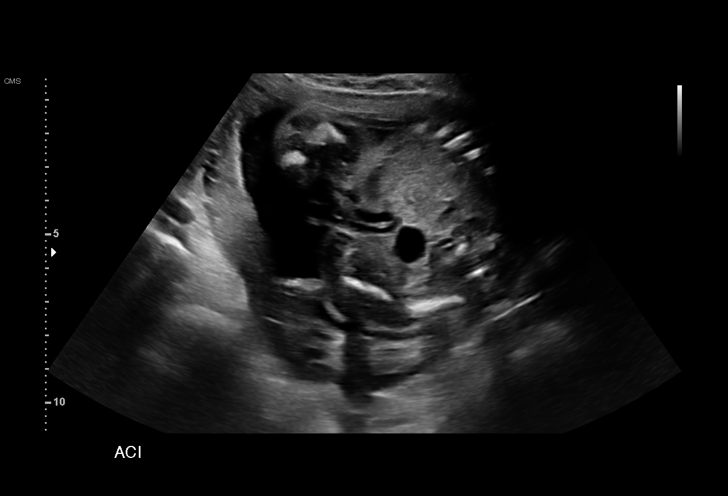

[13 of 28 positions shown; findings below may reference images not displayed]

Indications

 22 weeks gestation of pregnancy
 Antenatal screening for malformations
 History of sickle cell trait
 Low risk NIPS
Fetal Evaluation

 Num Of Fetuses:         1
 Fetal Heart Rate(bpm):  155
 Cardiac Activity:       Observed
 Presentation:           Breech
 Placenta:               Fundal
 P. Cord Insertion:      Visualized

 Amniotic Fluid
 AFI FV:      Within normal limits

                             Largest Pocket(cm)

Biometry

 BPD:      51.8  mm     G. Age:  21w 5d         13  %    CI:        71.04   %    70 - 86
                                                         FL/HC:      19.2   %    19.2 -
 HC:      195.8  mm     G. Age:  21w 5d          8  %    HC/AC:      1.11        1.05 -
 AC:      176.6  mm     G. Age:  22w 4d         37  %    FL/BPD:     72.4   %    71 - 87
 FL:       37.5  mm     G. Age:  22w 0d         17  %    FL/AC:      21.2   %    20 - 24
 CER:      24.2  mm     G. Age:  22w 2d         61  %
 LV:        4.5  mm
 CM:        4.2  mm

 Est. FW:     486  gm      1 lb 1 oz     21  %
OB History

 Gravidity:    2         Term:   1        Prem:   0        SAB:   0
 TOP:          0       Ectopic:  0        Living: 1
Gestational Age

 LMP:           22w 5d        Date:  07/04/20                 EDD:   04/10/21
 U/S Today:     22w 0d                                        EDD:   04/15/21
 Best:          22w 5d     Det. By:  LMP  (07/04/20)          EDD:   04/10/21
Anatomy

 Cranium:               Appears normal         LVOT:                   Appears normal
 Cavum:                 Appears normal         Aortic Arch:            Appears normal
 Ventricles:            Appears normal         Ductal Arch:            Appears normal
 Choroid Plexus:        Appears normal         Diaphragm:              Appears normal
 Cerebellum:            Appears normal         Stomach:                Appears normal, left
                                                                       sided
 Posterior Fossa:       Appears normal         Abdomen:                Appears normal
 Nuchal Fold:           Appears normal         Abdominal Wall:         Appears nml (cord
                                                                       insert, abd wall)
 Face:                  Appears normal         Cord Vessels:           Appears normal (3
                        (orbits and profile)                           vessel cord)
 Lips:                  Appears normal         Kidneys:                Appear normal
 Palate:                Appears normal         Bladder:                Appears normal
 Thoracic:              Appears normal         Spine:                  Appears normal
 Heart:                 Appears normal         Upper Extremities:      Appears normal
                        (4CH, axis, and
                        situs)
 RVOT:                  Appears normal         Lower Extremities:      Appears normal

 Other:  Heels/feet and open hands/5th digits visualized. Nasal bone
         visualized. Lenses visualized. Fetus appears to be female.
Cervix Uterus Adnexa

 Cervix
 Length:            3.3  cm.
 Normal appearance by transabdominal scan.

 Uterus
 No abnormality visualized.

 Right Ovary
 Within normal limits.

 Left Ovary
 Within normal limits.

 Cul De Sac
 No free fluid seen.
 Adnexa
 No abnormality visualized.
Impression

 Single intrauterine pregnancy here for a detailed exam due to
 sickle cell trait.
 Normal anatomy with measurements consistent with dates
 There is good fetal movement and amniotic fluid volume

 I discussed the normal nature of today's exam. If the partener
 has not been screened I encouraged her to have him screend
 for sickle cell trait as the risk for sickle cell disease increases.
 Lastly, q trimester urine cultures are recommended as
 increased frequency of urinary tract infections can occur
Recommendations

 Follow up growth as clinically indicated.
